# Patient Record
Sex: Female | Born: 1945 | Race: White | Hispanic: No | Smoking: Never smoker
Health system: Southern US, Community
[De-identification: ages and names within clinical notes are randomized; demographics above are authoritative.]

## PROBLEM LIST (undated history)

## (undated) DIAGNOSIS — E7800 Pure hypercholesterolemia, unspecified: Secondary | ICD-10-CM

## (undated) DIAGNOSIS — A044 Other intestinal Escherichia coli infections: Secondary | ICD-10-CM

## (undated) DIAGNOSIS — K449 Diaphragmatic hernia without obstruction or gangrene: Secondary | ICD-10-CM

---

## 2000-02-26 ENCOUNTER — Encounter: Admission: RE | Admit: 2000-02-26 | Discharge: 2000-02-26 | Payer: Self-pay | Admitting: *Deleted

## 2000-02-26 ENCOUNTER — Encounter: Payer: Self-pay | Admitting: Family Medicine

## 2001-09-09 ENCOUNTER — Ambulatory Visit (HOSPITAL_COMMUNITY): Admission: RE | Admit: 2001-09-09 | Discharge: 2001-09-09 | Payer: Self-pay | Admitting: Internal Medicine

## 2001-09-09 ENCOUNTER — Encounter: Payer: Self-pay | Admitting: Internal Medicine

## 2004-07-04 ENCOUNTER — Ambulatory Visit (HOSPITAL_COMMUNITY): Admission: RE | Admit: 2004-07-04 | Discharge: 2004-07-04 | Payer: Self-pay | Admitting: Family Medicine

## 2008-08-25 ENCOUNTER — Ambulatory Visit: Payer: Self-pay | Admitting: Orthopedic Surgery

## 2008-08-25 DIAGNOSIS — M7750 Other enthesopathy of unspecified foot: Secondary | ICD-10-CM | POA: Insufficient documentation

## 2008-08-29 ENCOUNTER — Telehealth: Payer: Self-pay | Admitting: Orthopedic Surgery

## 2008-08-30 ENCOUNTER — Telehealth: Payer: Self-pay | Admitting: Orthopedic Surgery

## 2008-08-30 ENCOUNTER — Ambulatory Visit: Payer: Self-pay | Admitting: Orthopedic Surgery

## 2008-09-19 ENCOUNTER — Encounter: Payer: Self-pay | Admitting: Orthopedic Surgery

## 2008-10-04 ENCOUNTER — Ambulatory Visit: Payer: Self-pay | Admitting: Orthopedic Surgery

## 2008-10-26 ENCOUNTER — Ambulatory Visit: Payer: Self-pay | Admitting: Orthopedic Surgery

## 2008-11-23 ENCOUNTER — Ambulatory Visit (HOSPITAL_COMMUNITY): Admission: RE | Admit: 2008-11-23 | Discharge: 2008-11-23 | Payer: Self-pay | Admitting: Nurse Practitioner

## 2008-11-23 IMAGING — CR DG CERVICAL SPINE COMPLETE 4+V
6 series · 6 of 6 positions shown · non-contrast
Comparison: No prior studies

CLINICAL DATA: Cervical pain and pain radiating down the left arm.

CERVICAL SPINE - COMPLETE 4+ VIEW

[view not recorded (1 of 6)]
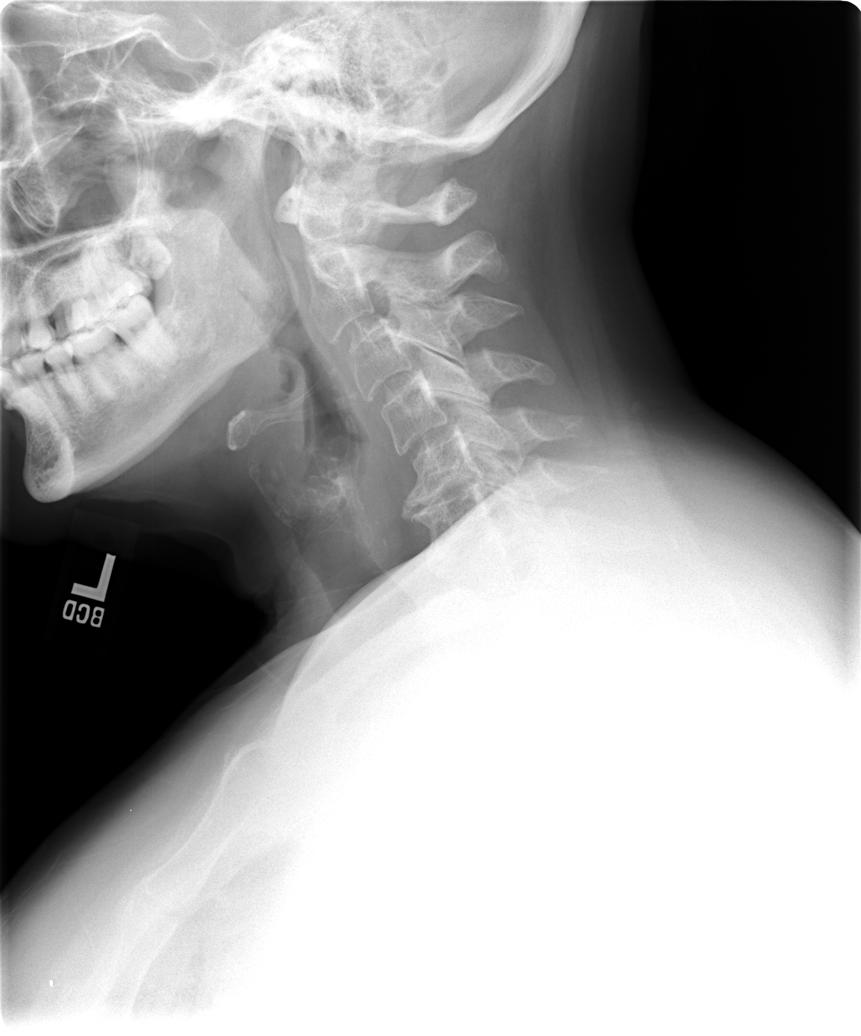

[view not recorded (2 of 6)]
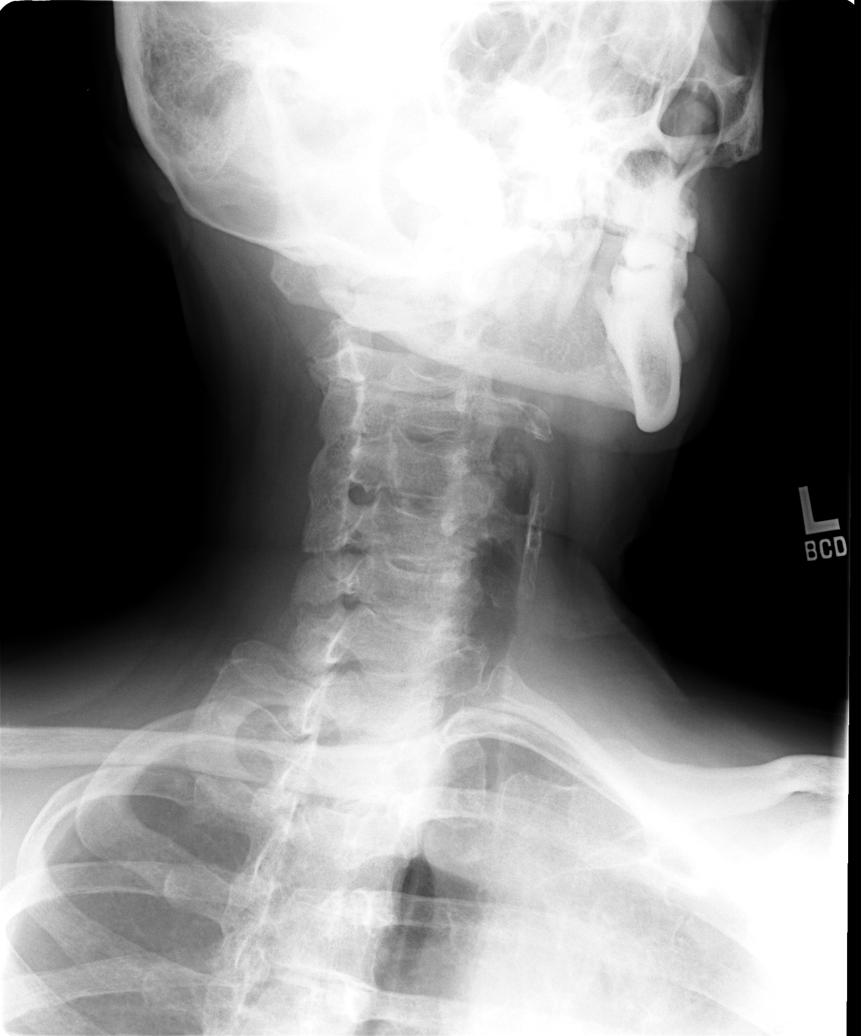

[view not recorded (3 of 6)]
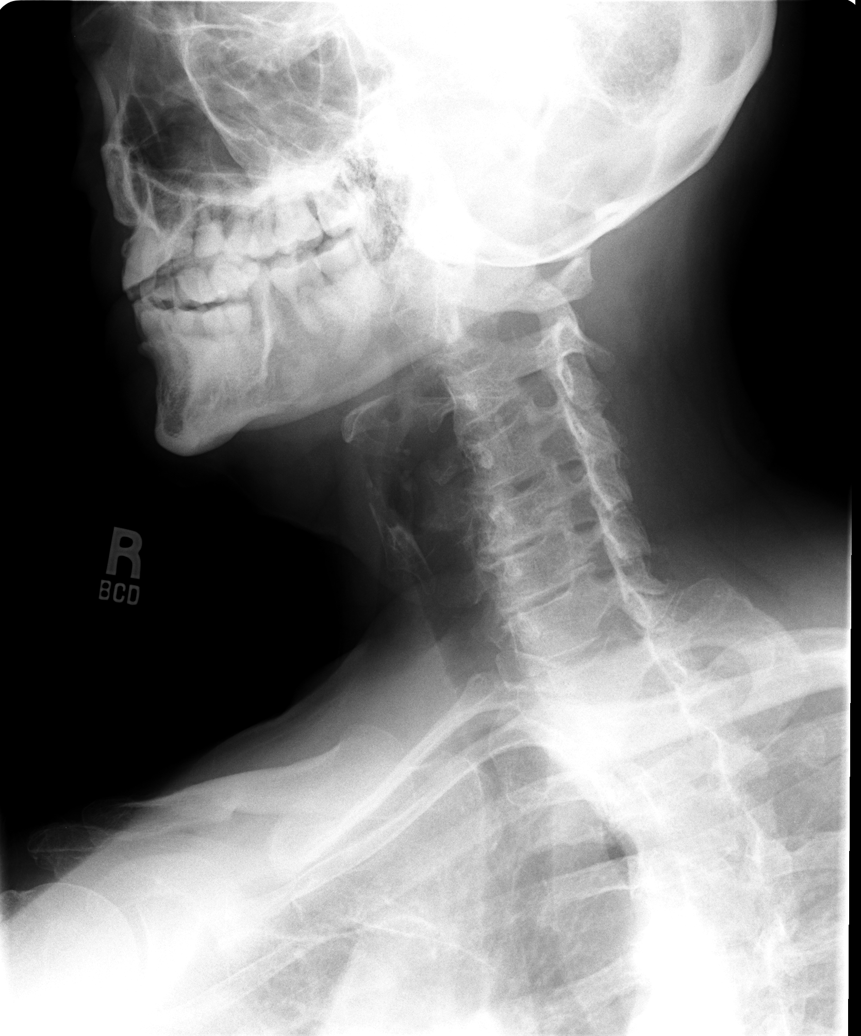

[view not recorded (4 of 6)]
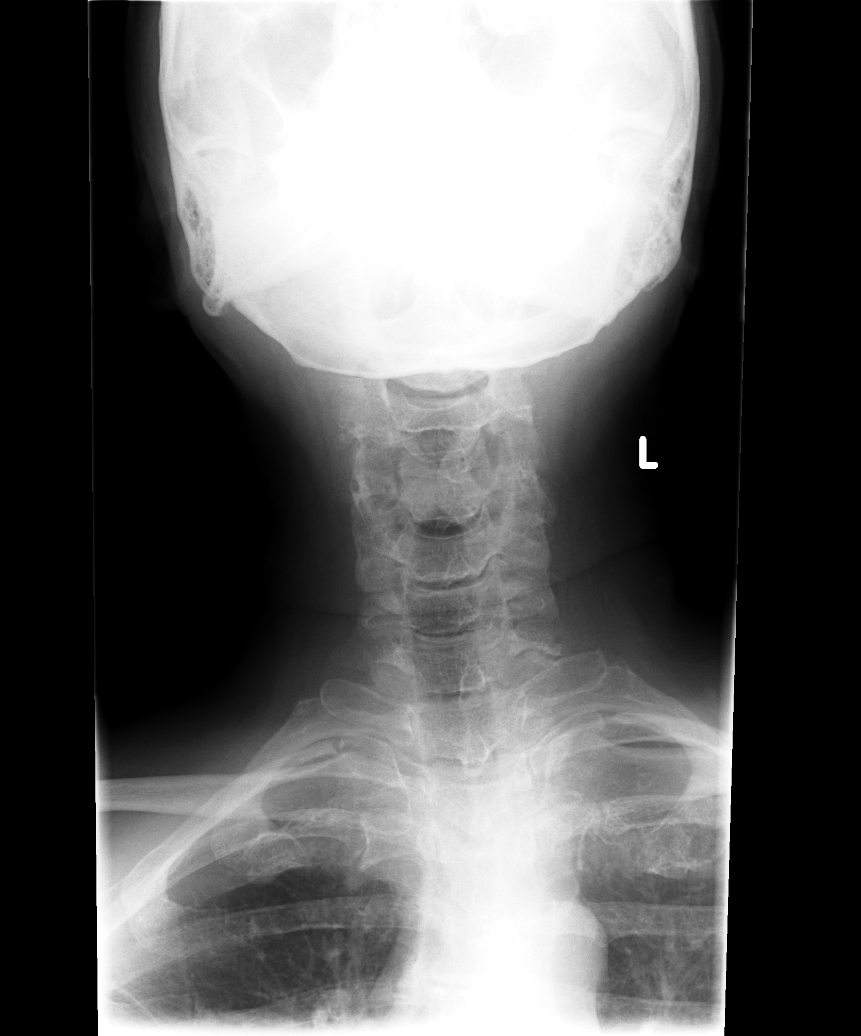

[view not recorded (5 of 6)]
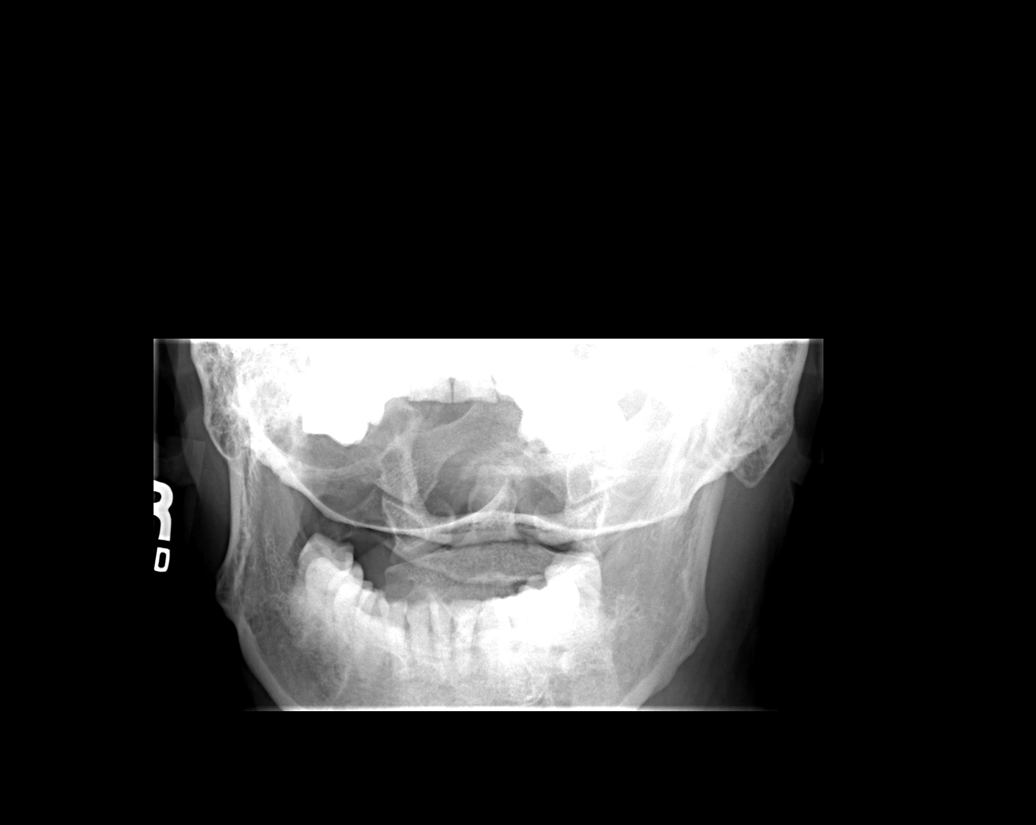

[view not recorded (6 of 6)]
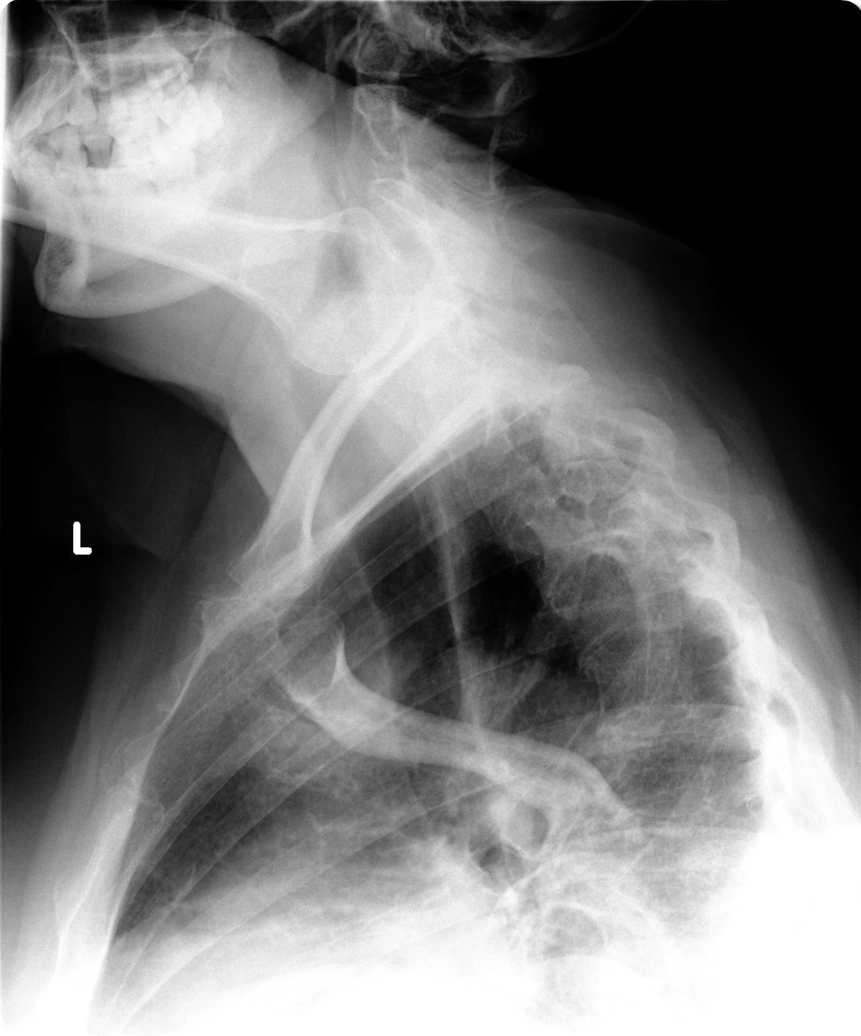

[6 of 6 positions shown; findings below may reference images not displayed]

FINDINGS: Degenerative space narrowing is noted C5-6 and C6-7
levels.  There is mild left C6-C7 bony foraminal narrowing.  The
right-sided cervical foramina are not well visualized on the
oblique view.  There are no destructive changes.
IMPRESSION: C5-6 and C6-7 degenerative space narrowing with mild left C6-7 bony
foraminal narrowing.  The right side cervical foramina are not well
visualized.

## 2008-12-23 ENCOUNTER — Encounter: Admission: RE | Admit: 2008-12-23 | Discharge: 2008-12-23 | Payer: Self-pay | Admitting: Family Medicine

## 2009-02-10 ENCOUNTER — Encounter: Admission: RE | Admit: 2009-02-10 | Discharge: 2009-02-10 | Payer: Self-pay | Admitting: Family Medicine

## 2009-02-28 ENCOUNTER — Ambulatory Visit (HOSPITAL_COMMUNITY): Admission: RE | Admit: 2009-02-28 | Discharge: 2009-02-28 | Payer: Self-pay | Admitting: Family Medicine

## 2009-03-07 ENCOUNTER — Encounter: Payer: Self-pay | Admitting: Internal Medicine

## 2009-03-20 ENCOUNTER — Ambulatory Visit (HOSPITAL_COMMUNITY): Admission: RE | Admit: 2009-03-20 | Discharge: 2009-03-20 | Payer: Self-pay | Admitting: Internal Medicine

## 2009-03-20 ENCOUNTER — Ambulatory Visit: Payer: Self-pay | Admitting: Internal Medicine

## 2009-03-24 ENCOUNTER — Encounter: Admission: RE | Admit: 2009-03-24 | Discharge: 2009-03-24 | Payer: Self-pay | Admitting: Family Medicine

## 2009-04-03 ENCOUNTER — Telehealth (INDEPENDENT_AMBULATORY_CARE_PROVIDER_SITE_OTHER): Payer: Self-pay

## 2009-04-05 ENCOUNTER — Telehealth (INDEPENDENT_AMBULATORY_CARE_PROVIDER_SITE_OTHER): Payer: Self-pay

## 2010-07-23 ENCOUNTER — Encounter: Payer: Self-pay | Admitting: Family Medicine

## 2012-08-24 ENCOUNTER — Other Ambulatory Visit (HOSPITAL_COMMUNITY): Payer: Self-pay | Admitting: Family Medicine

## 2012-09-01 ENCOUNTER — Other Ambulatory Visit (HOSPITAL_COMMUNITY): Payer: Self-pay

## 2012-09-01 ENCOUNTER — Ambulatory Visit (HOSPITAL_COMMUNITY): Payer: Self-pay

## 2012-09-08 ENCOUNTER — Ambulatory Visit (HOSPITAL_COMMUNITY)
Admission: RE | Admit: 2012-09-08 | Discharge: 2012-09-08 | Disposition: A | Discharge status: Home / Self Care | Payer: Medicare Other | Source: Ambulatory Visit | Attending: Family Medicine | Admitting: Family Medicine

## 2012-09-08 DIAGNOSIS — M899 Disorder of bone, unspecified: Secondary | ICD-10-CM | POA: Insufficient documentation

## 2012-09-08 DIAGNOSIS — Z780 Asymptomatic menopausal state: Secondary | ICD-10-CM | POA: Insufficient documentation

## 2012-09-08 DIAGNOSIS — Z1231 Encounter for screening mammogram for malignant neoplasm of breast: Secondary | ICD-10-CM | POA: Insufficient documentation

## 2012-09-09 ENCOUNTER — Other Ambulatory Visit (HOSPITAL_COMMUNITY): Payer: Self-pay

## 2015-01-25 ENCOUNTER — Emergency Department (HOSPITAL_COMMUNITY): Payer: Medicare HMO

## 2015-01-25 ENCOUNTER — Encounter (HOSPITAL_COMMUNITY): Payer: Self-pay | Admitting: Emergency Medicine

## 2015-01-25 ENCOUNTER — Inpatient Hospital Stay (HOSPITAL_COMMUNITY)
Admission: EM | Admit: 2015-01-25 | Discharge: 2015-01-27 | DRG: 445 | Disposition: A | Discharge status: Home / Self Care | Payer: Medicare HMO | Attending: Family Medicine | Admitting: Family Medicine

## 2015-01-25 ENCOUNTER — Emergency Department (HOSPITAL_COMMUNITY)
Admission: EM | Admit: 2015-01-25 | Discharge: 2015-01-25 | Disposition: A | Discharge status: Home / Self Care | Payer: Medicare HMO | Source: Home / Self Care | Attending: Emergency Medicine | Admitting: Emergency Medicine

## 2015-01-25 DIAGNOSIS — K810 Acute cholecystitis: Secondary | ICD-10-CM | POA: Diagnosis present

## 2015-01-25 DIAGNOSIS — K838 Other specified diseases of biliary tract: Secondary | ICD-10-CM | POA: Insufficient documentation

## 2015-01-25 DIAGNOSIS — Z8719 Personal history of other diseases of the digestive system: Secondary | ICD-10-CM | POA: Insufficient documentation

## 2015-01-25 DIAGNOSIS — Z7982 Long term (current) use of aspirin: Secondary | ICD-10-CM | POA: Insufficient documentation

## 2015-01-25 DIAGNOSIS — R52 Pain, unspecified: Secondary | ICD-10-CM

## 2015-01-25 DIAGNOSIS — R739 Hyperglycemia, unspecified: Secondary | ICD-10-CM

## 2015-01-25 DIAGNOSIS — E780 Pure hypercholesterolemia: Secondary | ICD-10-CM | POA: Insufficient documentation

## 2015-01-25 DIAGNOSIS — R197 Diarrhea, unspecified: Secondary | ICD-10-CM

## 2015-01-25 DIAGNOSIS — D649 Anemia, unspecified: Secondary | ICD-10-CM | POA: Diagnosis present

## 2015-01-25 DIAGNOSIS — Z8619 Personal history of other infectious and parasitic diseases: Secondary | ICD-10-CM

## 2015-01-25 DIAGNOSIS — K449 Diaphragmatic hernia without obstruction or gangrene: Secondary | ICD-10-CM | POA: Diagnosis present

## 2015-01-25 DIAGNOSIS — K8309 Other cholangitis: Secondary | ICD-10-CM | POA: Insufficient documentation

## 2015-01-25 DIAGNOSIS — K219 Gastro-esophageal reflux disease without esophagitis: Secondary | ICD-10-CM | POA: Diagnosis not present

## 2015-01-25 DIAGNOSIS — R140 Abdominal distension (gaseous): Secondary | ICD-10-CM | POA: Insufficient documentation

## 2015-01-25 DIAGNOSIS — Z79899 Other long term (current) drug therapy: Secondary | ICD-10-CM | POA: Insufficient documentation

## 2015-01-25 DIAGNOSIS — R1011 Right upper quadrant pain: Secondary | ICD-10-CM | POA: Diagnosis not present

## 2015-01-25 DIAGNOSIS — K8066 Calculus of gallbladder and bile duct with acute and chronic cholecystitis without obstruction: Secondary | ICD-10-CM | POA: Diagnosis not present

## 2015-01-25 DIAGNOSIS — R109 Unspecified abdominal pain: Secondary | ICD-10-CM

## 2015-01-25 DIAGNOSIS — K819 Cholecystitis, unspecified: Secondary | ICD-10-CM

## 2015-01-25 DIAGNOSIS — R110 Nausea: Secondary | ICD-10-CM | POA: Insufficient documentation

## 2015-01-25 DIAGNOSIS — Z8249 Family history of ischemic heart disease and other diseases of the circulatory system: Secondary | ICD-10-CM

## 2015-01-25 DIAGNOSIS — R1084 Generalized abdominal pain: Secondary | ICD-10-CM

## 2015-01-25 DIAGNOSIS — E871 Hypo-osmolality and hyponatremia: Secondary | ICD-10-CM

## 2015-01-25 DIAGNOSIS — E785 Hyperlipidemia, unspecified: Secondary | ICD-10-CM

## 2015-01-25 DIAGNOSIS — K8050 Calculus of bile duct without cholangitis or cholecystitis without obstruction: Secondary | ICD-10-CM | POA: Insufficient documentation

## 2015-01-25 DIAGNOSIS — K8062 Calculus of gallbladder and bile duct with acute cholecystitis without obstruction: Principal | ICD-10-CM | POA: Diagnosis present

## 2015-01-25 HISTORY — DX: Diaphragmatic hernia without obstruction or gangrene: K44.9

## 2015-01-25 HISTORY — DX: Pure hypercholesterolemia, unspecified: E78.00

## 2015-01-25 HISTORY — DX: Other intestinal Escherichia coli infections: A04.4

## 2015-01-25 LAB — COMPREHENSIVE METABOLIC PANEL
ALK PHOS: 70 U/L (ref 38–126)
ALT: 42 U/L (ref 14–54)
AST: 29 U/L (ref 15–41)
Albumin: 3.4 g/dL — ABNORMAL LOW (ref 3.5–5.0)
Anion gap: 10 (ref 5–15)
BILIRUBIN TOTAL: 0.4 mg/dL (ref 0.3–1.2)
BUN: 10 mg/dL (ref 6–20)
CALCIUM: 8.6 mg/dL — AB (ref 8.9–10.3)
CO2: 26 mmol/L (ref 22–32)
Chloride: 96 mmol/L — ABNORMAL LOW (ref 101–111)
Creatinine, Ser: 0.69 mg/dL (ref 0.44–1.00)
GFR calc non Af Amer: >60 mL/min (ref >60)
Glucose, Bld: 179 mg/dL — ABNORMAL HIGH (ref 65–99)
POTASSIUM: 4.3 mmol/L (ref 3.5–5.1)
SODIUM: 132 mmol/L — AB (ref 135–145)
Total Protein: 8 g/dL (ref 6.5–8.1)

## 2015-01-25 LAB — CBC WITH DIFFERENTIAL/PLATELET
BASOS PCT: 0 % (ref 0–1)
Basophils Absolute: 0 10*3/uL (ref 0.0–0.1)
Eosinophils Absolute: 0 10*3/uL (ref 0.0–0.7)
Eosinophils Relative: 0 % (ref 0–5)
HCT: 38 % (ref 36.0–46.0)
HEMOGLOBIN: 12.5 g/dL (ref 12.0–15.0)
LYMPHS ABS: 1.2 10*3/uL (ref 0.7–4.0)
Lymphocytes Relative: 9 % — ABNORMAL LOW (ref 12–46)
MCH: 29.4 pg (ref 26.0–34.0)
MCHC: 32.9 g/dL (ref 30.0–36.0)
MCV: 89.4 fL (ref 78.0–100.0)
MONO ABS: 1.5 10*3/uL — AB (ref 0.1–1.0)
MONOS PCT: 11 % (ref 3–12)
NEUTROS ABS: 10.9 10*3/uL — AB (ref 1.7–7.7)
Neutrophils Relative %: 80 % — ABNORMAL HIGH (ref 43–77)
Platelets: 272 10*3/uL (ref 150–400)
RBC: 4.25 MIL/uL (ref 3.87–5.11)
RDW: 12.7 % (ref 11.5–15.5)
WBC: 13.6 10*3/uL — AB (ref 4.0–10.5)

## 2015-01-25 LAB — LIPASE, BLOOD: Lipase: 16 U/L — ABNORMAL LOW (ref 22–51)

## 2015-01-25 MED ORDER — METRONIDAZOLE IN NACL 5-0.79 MG/ML-% IV SOLN
500.0000 mg | Freq: Once | INTRAVENOUS | Status: AC
  Administered 2015-01-25: 500 mg via INTRAVENOUS
  Filled 2015-01-25: qty 100

## 2015-01-25 MED ORDER — SODIUM CHLORIDE 0.9 % IV SOLN
INTRAVENOUS | Status: DC
  Administered 2015-01-25 – 2015-01-26 (×2): via INTRAVENOUS

## 2015-01-25 MED ORDER — HYDROMORPHONE HCL 1 MG/ML IJ SOLN: 0.5000 mg | INTRAMUSCULAR | Status: AC | PRN

## 2015-01-25 MED ORDER — PANTOPRAZOLE SODIUM 40 MG PO TBEC
40.0000 mg | DELAYED_RELEASE_TABLET | Freq: Every day | ORAL | Status: DC
  Administered 2015-01-26: 40 mg via ORAL
  Filled 2015-01-25: qty 1

## 2015-01-25 MED ORDER — CIPROFLOXACIN IN D5W 400 MG/200ML IV SOLN
400.0000 mg | Freq: Once | INTRAVENOUS | Status: AC
  Administered 2015-01-25: 400 mg via INTRAVENOUS
  Filled 2015-01-25: qty 200

## 2015-01-25 MED ORDER — ACETAMINOPHEN 650 MG RE SUPP: 650.0000 mg | Freq: Four times a day (QID) | RECTAL | Status: DC | PRN

## 2015-01-25 MED ORDER — ONDANSETRON HCL 4 MG/2ML IJ SOLN
4.0000 mg | Freq: Once | INTRAMUSCULAR | Status: AC
  Administered 2015-01-25: 4 mg via INTRAVENOUS
  Filled 2015-01-25: qty 2

## 2015-01-25 MED ORDER — ONDANSETRON HCL 4 MG/2ML IJ SOLN: 4.0000 mg | Freq: Four times a day (QID) | INTRAMUSCULAR | Status: DC | PRN

## 2015-01-25 MED ORDER — SODIUM CHLORIDE 0.9 % IV SOLN: INTRAVENOUS | Status: AC

## 2015-01-25 MED ORDER — PANTOPRAZOLE SODIUM 40 MG IV SOLR
40.0000 mg | Freq: Once | INTRAVENOUS | Status: AC
  Administered 2015-01-25: 40 mg via INTRAVENOUS
  Filled 2015-01-25: qty 40

## 2015-01-25 MED ORDER — SODIUM CHLORIDE 0.9 % IV BOLUS (SEPSIS)
1000.0000 mL | Freq: Once | INTRAVENOUS | Status: AC
  Administered 2015-01-25: 1000 mL via INTRAVENOUS

## 2015-01-25 MED ORDER — ONDANSETRON HCL 4 MG/2ML IJ SOLN: 4.0000 mg | Freq: Three times a day (TID) | INTRAMUSCULAR | Status: AC | PRN

## 2015-01-25 MED ORDER — IOHEXOL 300 MG/ML  SOLN
25.0000 mL | Freq: Once | INTRAMUSCULAR | Status: AC | PRN
  Administered 2015-01-25: 25 mL via ORAL

## 2015-01-25 MED ORDER — ACETAMINOPHEN 325 MG PO TABS
650.0000 mg | ORAL_TABLET | Freq: Four times a day (QID) | ORAL | Status: DC | PRN
  Administered 2015-01-26 – 2015-01-27 (×2): 650 mg via ORAL
  Filled 2015-01-25 (×2): qty 2

## 2015-01-25 MED ORDER — IOHEXOL 300 MG/ML  SOLN
100.0000 mL | Freq: Once | INTRAMUSCULAR | Status: AC | PRN
  Administered 2015-01-25: 100 mL via INTRAVENOUS

## 2015-01-25 MED ORDER — ONDANSETRON HCL 4 MG PO TABS: 4.0000 mg | ORAL_TABLET | Freq: Four times a day (QID) | ORAL | Status: DC | PRN

## 2015-01-25 MED ORDER — ASPIRIN EC 81 MG PO TBEC
81.0000 mg | DELAYED_RELEASE_TABLET | Freq: Every day | ORAL | Status: DC
  Administered 2015-01-25 – 2015-01-26 (×2): 81 mg via ORAL
  Filled 2015-01-25 (×2): qty 1

## 2015-01-25 MED ORDER — SODIUM CHLORIDE 0.9 % IV SOLN
INTRAVENOUS | Status: DC
  Administered 2015-01-25 – 2015-01-27 (×2): via INTRAVENOUS

## 2015-01-25 MED ORDER — HEPARIN SODIUM (PORCINE) 5000 UNIT/ML IJ SOLN
5000.0000 [IU] | Freq: Three times a day (TID) | INTRAMUSCULAR | Status: DC
  Administered 2015-01-25 – 2015-01-26 (×2): 5000 [IU] via SUBCUTANEOUS
  Filled 2015-01-25 (×2): qty 1

## 2015-01-25 MED ORDER — CIPROFLOXACIN IN D5W 400 MG/200ML IV SOLN
400.0000 mg | Freq: Two times a day (BID) | INTRAVENOUS | Status: DC
  Administered 2015-01-26: 400 mg via INTRAVENOUS
  Filled 2015-01-25: qty 200

## 2015-01-25 MED ORDER — SIMVASTATIN 20 MG PO TABS
20.0000 mg | ORAL_TABLET | Freq: Every day | ORAL | Status: DC
  Administered 2015-01-25: 20 mg via ORAL
  Filled 2015-01-25: qty 1

## 2015-01-25 MED ORDER — MORPHINE SULFATE 4 MG/ML IJ SOLN
4.0000 mg | INTRAMUSCULAR | Status: DC | PRN
  Administered 2015-01-25 – 2015-01-26 (×2): 4 mg via INTRAVENOUS
  Filled 2015-01-25 (×2): qty 1

## 2015-01-25 MED ORDER — HYDROMORPHONE HCL 1 MG/ML IJ SOLN
1.0000 mg | Freq: Once | INTRAMUSCULAR | Status: AC
  Administered 2015-01-25: 1 mg via INTRAVENOUS
  Filled 2015-01-25: qty 1

## 2015-01-25 NOTE — ED Notes (Addendum)
Placed pt on 3L [place]. Pt's sats drop into 80s when resting. Pt sats 94-95% on 3L.

## 2015-01-25 NOTE — H&P (Signed)
[facility] History and Physical  Melissa Bullock JXB:[id number] DOB: 1946-02-26 DOA: 01/25/2015  Referring physician: Dr Clarene Duke - [facility] PCP: Alice Reichert, MD   Chief Complaint: Abd pain  HPI: Melissa Bullock is a 69 y.o. female  Abdominal pain. Right upper quadrant. Ongoing for 2 weeks. Comes and goes. Associated with loose stools. Started a PPI and Carafate without improvement. Worse with meals. Nothing makes her symptoms better other than not eating. Pain does not radiate.    Review of Systems:  Constitutional:  No night sweats, Fevers, chills, fatigue.  HEENT:  No headaches, Difficulty swallowing,Tooth/dental problems,Sore throat,  No sneezing, itching, ear ache, nasal congestion, post nasal drip,  Cardio-vascular:  No chest pain, Orthopnea, PND, swelling in lower extremities, anasarca, dizziness, palpitations  GI: Per HPI Resp:   No shortness of breath with exertion or at rest. No excess mucus, no productive cough, No non-productive cough, No coughing up of blood.No change in color of mucus.No wheezing.No chest wall deformity  Skin:  no rash or lesions.  GU:  no dysuria, change in color of urine, no urgency or frequency. No flank pain.  Musculoskeletal:   No joint pain or swelling. No decreased range of motion. No back pain.  Psych:  No change in mood or affect. No depression or anxiety. No memory loss.   Past Medical History  Diagnosis Date  . Hypercholesteremia   . E coli enteritis   . Hiatal hernia    History reviewed. No pertinent past surgical history. Social History:  reports that she has never smoked. She does not have any smokeless tobacco history on file. She reports that she does not drink alcohol. Her drug history is not on file.  Allergies  Allergen Reactions  . Sulfonamide Derivatives Nausea And Vomiting    Family History  Problem Relation Age of Onset  . Heart disease Mother      Prior to Admission medications   Medication Sig  Start Date End Date Taking? Authorizing Provider  alum & mag hydroxide-simeth (MAALOX PLUS) 400-400-40 MG/5ML suspension Take 15 mLs by mouth every 6 (six) hours as needed for indigestion.   Yes Historical Provider, MD  aspirin EC 81 MG tablet Take 81 mg by mouth daily.   Yes Historical Provider, MD  co-enzyme Q-10 30 MG capsule Take 30 mg by mouth 3 (three) times daily.   Yes Historical Provider, MD  dicyclomine (BENTYL) 10 MG capsule Take 10 mg by mouth 4 (four) times daily -  before meals and at bedtime.   Yes Historical Provider, MD  Multiple Vitamin (MULTIVITAMIN WITH MINERALS) TABS tablet Take 1 tablet by mouth daily.   Yes Historical Provider, MD  multivitamin-lutein (OCUVITE-LUTEIN) CAPS capsule Take 1 capsule by mouth daily.   Yes Historical Provider, MD  pantoprazole (PROTONIX) 40 MG tablet Take 40 mg by mouth daily.   Yes Historical Provider, MD  simvastatin (ZOCOR) 20 MG tablet Take 20 mg by mouth daily.   Yes Historical Provider, MD  sucralfate (CARAFATE) 1 GM/10ML suspension Take 1 g by mouth 4 (four) times daily -  with meals and at bedtime. Starting 01/20/2015 x 10 days.   Yes Historical Provider, MD   Physical Exam: Filed Vitals:   01/25/15 1645 01/25/15 1845  BP: 105/82 124/49  Pulse: 94 105  Temp: 100.5 F (38.1 C) 100.2 F (37.9 C)  TempSrc: Oral Oral  Resp: 18 18  Height: 5\' 4"  (1.626 m) 5\' 4"  (1.626 m)  Weight: 79.379 kg (175 lb) 81.784 kg (  180 lb 4.8 oz)  SpO2: 94% 92%    Wt Readings from Last 3 Encounters:  01/25/15 81.784 kg (180 lb 4.8 oz)  01/25/15 79.379 kg (175 lb)  08/25/08 84.823 kg (187 lb)    General:  Appears calm and comfortable Eyes:  PERRL, normal lids, irises & conjunctiva ENT:  grossly normal hearing, lips & tongue Neck:  no LAD, masses or thyromegaly Cardiovascular:  RRR, no m/r/g. No LE edema. Telemetry:  SR, no arrhythmias  Respiratory:  CTA bilaterally, no w/r/r. Normal respiratory effort. Abdomen:  Nondistended, normoactive bowel  sounds, mild tenderness to palpation of the right upper quadrant. Skin:  no rash or induration seen on limited exam Musculoskeletal:  grossly normal tone BUE/BLE Psychiatric:  grossly normal mood and affect, speech fluent and appropriate Neurologic:  grossly non-focal.          Labs on Admission:  Basic Metabolic Panel:  Recent Labs Lab 01/25/15 0915  NA 132*  K 4.3  CL 96*  CO2 26  GLUCOSE 179*  BUN 10  CREATININE 0.69  CALCIUM 8.6*   Liver Function Tests:  Recent Labs Lab 01/25/15 0915  AST 29  ALT 42  ALKPHOS 70  BILITOT 0.4  PROT 8.0  ALBUMIN 3.4*    Recent Labs Lab 01/25/15 0915  LIPASE 16*   No results for input(s): AMMONIA in the last 168 hours. CBC:  Recent Labs Lab 01/25/15 0915  WBC 13.6*  NEUTROABS 10.9*  HGB 12.5  HCT 38.0  MCV 89.4  PLT 272   Cardiac Enzymes: No results for input(s): CKTOTAL, CKMB, CKMBINDEX, TROPONINI in the last 168 hours.  BNP (last 3 results) No results for input(s): BNP in the last 8760 hours.  ProBNP (last 3 results) No results for input(s): PROBNP in the last 8760 hours.  CBG: No results for input(s): GLUCAP in the last 168 hours.  Radiological Exams on Admission: Ct Abdomen Pelvis W Contrast  01/25/2015   CLINICAL DATA:  Upper abdominal pain for 2 weeks  EXAM: CT ABDOMEN AND PELVIS WITH CONTRAST  TECHNIQUE: Multidetector CT imaging of the abdomen and pelvis was performed using the standard protocol following bolus administration of intravenous contrast.  CONTRAST:  25mL OMNIPAQUE IOHEXOL 300 MG/ML SOLN, OMNIPAQUE IOHEXOL 300 MG/ML SOLN  COMPARISON:  None.  FINDINGS: The gallbladder is markedly distended. There is wall thickening. There are inflammatory changes about the gallbladder. Findings are compatible with acute cholecystitis.  There is also noted to be significant intrahepatic biliary dilatation. The common bile duct is also dilated up to 13 mm. There is no obvious pancreatic head mass. There are no  definite ductal stones.  Spleen, remainder of the pancreas, adrenal glands, and kidneys are within normal limits.  Small periportal and gastrohepatic ligament nodes are present. Para-aortic nodes are noted but none are greater than 1 cm in short axis diameter  Retro aortic left renal vein anatomy.  Normal appendix.  Bladder, uterus, and adnexa are unremarkable  Sigmoid diverticulosis without evidence of acute diverticulitis.  Advanced degenerative disc disease at L4-5 and L5-S1 with posterior disc osteophytes and vacuum. Concentric disc bulge at L3-4 is noted.  IMPRESSION: Findings above of compatible with acute cholecystitis. The gallbladder is dilated.  There is intra and extrahepatic biliary dilatation should testing biliary obstruction. There is no obvious common duct stone or pancreatic head mass. ERCP or MRCP would be helpful to further characterize.   Electronically Signed   By: Jolaine Click M.D.   On: 01/25/2015 11:16  US Abdomen Limited Ruq  01/25/2015   CLINICAL DATA:  Acute right upper quadrant abdominal pain.  EXAM: US ABDOMEN LIMITED - RIGHT UPPER QUADRANT  COMPARISON:  CT scan of same day.  FINDINGS: Gallbladder:  Cholelithiasis is noted with moderate gallbladder wall thickening measuring approximately 7 mm. No sonographic Murphy's sign is noted. No pericholecystic fluid is noted.  Common bile duct:  Diameter: 12 mm which is abnormally dilated.  Liver:  Intrahepatic ductal dilatation is noted. Within normal limits in parenchymal echogenicity.  IMPRESSION: Cholelithiasis is noted with significant gallbladder wall thickening concerning for acute cholecystitis. Significant intrahepatic and extrahepatic biliary dilatation is noted concerning for distal common bile duct obstruction.   Electronically Signed   By: Lupita Raider, M.D.   On: 01/25/2015 13:26      Assessment/Plan Principal Problem:   Acute cholecystitis Active Problems:   Hyponatremia   Hyperglycemia   HLD (hyperlipidemia)    GERD without esophagitis   Abd pain: Likely from occult acute cholecystitis as noted on ultrasound and CT. Surgery consult by ED physician and agrees with admission for likely surgery. Lipase nml - MedSurg - Nothing by mouth after midnight -  coags  - IVF  - EKG (fm h/o heart disease) - f/u Surgery recs.   Hyponatremia: 132 on admission - IVF  Hyperglycemia: - A1c  HLD: - continue statin  GERD/hiatal Hernia: - continue PPI   Code Status: FULL DVT Prophylaxis: Hep Family Communication: None (indicate person spoken with, if applicable, with phone number if by telephone)  Disposition Plan: Pending improvement  [place] Severs, Elmon Else, MD Family Medicine [facility] [email or web address] Password [email or web address]

## 2015-01-25 NOTE — Progress Notes (Signed)
ANTIBIOTIC CONSULT NOTE-Preliminary  Pharmacy Consult for Cipro Indication: cholecystitis  Allergies  Allergen Reactions  . Sulfonamide Derivatives Nausea And Vomiting    Patient Measurements: Height:  (162.6 cm) Weight: 175 lb (79.379 kg) IBW/kg (Calculated) : 54.7  Vital Signs: Temp: 100.5 F (38.1 C) (07/27 1645) Temp Source: Oral (07/27 1645) BP: 105/82 mmHg (07/27 1645) Pulse Rate: 94 (07/27 1645)  Labs:  Recent Labs  01/25/15 0915  WBC 13.6*  HGB 12.5  PLT 272  CREATININE 0.69    Estimated Creatinine Clearance: 68.6 mL/min (by C-G formula based on Cr of 0.69).  No results for input(s): VANCOTROUGH, VANCOPEAK, VANCORANDOM, GENTTROUGH, GENTPEAK, GENTRANDOM, TOBRATROUGH, TOBRAPEAK, TOBRARND, AMIKACINPEAK, AMIKACINTROU, AMIKACIN in the last 72 hours.   Microbiology: No results found for this or any previous visit (from the past 720 hour(s)).  Medical History: Past Medical History  Diagnosis Date  . Hypercholesteremia   . E coli enteritis   . Hiatal hernia    Anti-infectives    Start     Dose/Rate Route Frequency Ordered Stop   01/25/15 1730  ciprofloxacin (CIPRO) IVPB 400 mg     400 mg 200 mL/hr over 60 Minutes Intravenous  Once 01/25/15 1659     01/25/15 1700  metroNIDAZOLE (FLAGYL) IVPB 500 mg     500 mg 100 mL/hr over 60 Minutes Intravenous  Once 01/25/15 1649        Assessment: 69yo female with good renal fxn.  Pt admitted with cholecystitis, pharmacy asked to initiate Cipro.    Goal of Therapy:  Eradicate infection.  Plan:  Preliminary review of pertinent patient information completed.  Protocol will be initiated with a one-time dose(s) of Cipro .  Jeani Hawking clinical pharmacist will complete review during morning rounds to assess patient and finalize treatment regimen.  Valrie Hart A, RPH 01/25/2015,5:00 PM

## 2015-01-25 NOTE — ED Provider Notes (Signed)
CSN: [id number]     Arrival date & time 01/25/15  4098 History   This chart was scribed for Melissa Berkshire, MD by Evon Slack, ED Scribe. This patient was seen in room [facility] and the patient's care was started at 8:58 AM.      Chief Complaint  Patient presents with  . Abdominal Pain    Patient is a 69 y.o. female presenting with abdominal pain. The history is provided by the patient. No language interpreter was used.  Abdominal Pain Pain location:  Generalized Pain severity:  Moderate Onset quality:  Gradual Duration:  2 weeks Relieved by:  Nothing Ineffective treatments:  Antacids Associated symptoms: diarrhea and nausea   Associated symptoms: no chest pain, no cough, no fatigue, no hematuria and no vomiting    HPI Comments: Melissa Bullock is a 69 y.o. female who presents to the Emergency Department complaining of abdominal pain onset 2 weeks prior. She states she has associated distention, nausea and watery diarrhea. Pt states that she went to her PCP 5 days prior and was prescribed Carafate and Protonix with no relief. She states that he PCP told her she may have IBS. Pt doesn't report any alleviating symptoms. Pt reports Hx of ecoli several years prior.   Past Medical History  Diagnosis Date  . Hypercholesteremia   . E coli enteritis   . Hiatal hernia    History reviewed. No pertinent past surgical history. No family history on file. History  Substance Use Topics  . Smoking status: Never Smoker   . Smokeless tobacco: Not on file  . Alcohol Use: No   OB History    No data available      Review of Systems  Constitutional: Negative for appetite change and fatigue.  HENT: Negative for congestion, ear discharge and sinus pressure.   Eyes: Negative for discharge.  Respiratory: Negative for cough.   Cardiovascular: Negative for chest pain.  Gastrointestinal: Positive for nausea, abdominal pain, diarrhea and abdominal distention. Negative for vomiting.   Genitourinary: Negative for frequency and hematuria.  Musculoskeletal: Negative for back pain.  Skin: Negative for rash.  Neurological: Negative for seizures and headaches.  Psychiatric/Behavioral: Negative for hallucinations.  All other systems reviewed and are negative.    Allergies  Sulfonamide derivatives  Home Medications   Prior to Admission medications   Medication Sig Start Date End Date Taking? Authorizing Provider  alum & mag hydroxide-simeth (MAALOX PLUS) 400-400-40 MG/5ML suspension Take 15 mLs by mouth every 6 (six) hours as needed for indigestion.   Yes Historical Provider, MD  aspirin EC 81 MG tablet Take 81 mg by mouth daily.   Yes Historical Provider, MD  co-enzyme Q-10 30 MG capsule Take 30 mg by mouth 3 (three) times daily.   Yes Historical Provider, MD  dicyclomine (BENTYL) 10 MG capsule Take 10 mg by mouth 4 (four) times daily -  before meals and at bedtime.   Yes Historical Provider, MD  Multiple Vitamin (MULTIVITAMIN WITH MINERALS) TABS tablet Take 1 tablet by mouth daily.   Yes Historical Provider, MD  multivitamin-lutein (OCUVITE-LUTEIN) CAPS capsule Take 1 capsule by mouth daily.   Yes Historical Provider, MD  pantoprazole (PROTONIX) 40 MG tablet Take 40 mg by mouth daily.   Yes Historical Provider, MD  simvastatin (ZOCOR) 20 MG tablet Take 20 mg by mouth daily.   Yes Historical Provider, MD  sucralfate (CARAFATE) 1 GM/10ML suspension Take 1 g by mouth 4 (four) times daily -  with meals and at  bedtime. Starting 01/20/2015 x 10 days.   Yes Historical Provider, MD   BP 157/70 mmHg  Pulse 98  Temp(Src) 99.2 F (37.3 C) (Oral)  Resp 18  Ht 5' 4.5" (1.638 m)  Wt 175 lb (79.379 kg)  BMI 29.59 kg/m2  SpO2 96%   Physical Exam  Constitutional: She is oriented to person, place, and time. She appears well-developed.  HENT:  Head: Normocephalic.  Eyes: Conjunctivae and EOM are normal. No scleral icterus.  Neck: Neck supple. No thyromegaly present.   Cardiovascular: Normal rate and regular rhythm.  Exam reveals no gallop and no friction rub.   No murmur heard. Pulmonary/Chest: No stridor. She has no wheezes. She has no rales. She exhibits no tenderness.  Abdominal: She exhibits no distension. There is generalized tenderness (moderate ). There is no rebound.  Musculoskeletal: Normal range of motion. She exhibits no edema.  Lymphadenopathy:    She has no cervical adenopathy.  Neurological: She is oriented to person, place, and time. She exhibits normal muscle tone. Coordination normal.  Skin: No rash noted. No erythema.  Psychiatric: She has a normal mood and affect. Her behavior is normal.  Nursing note and vitals reviewed.   ED Course  Procedures (including critical care time) DIAGNOSTIC STUDIES: Oxygen Saturation is 98% on RA, normal by my interpretation.    COORDINATION OF CARE: 9:11 AM-Discussed treatment plan with pt at bedside and pt agreed to plan.     Labs Review Labs Reviewed  CBC WITH DIFFERENTIAL/PLATELET - Abnormal; Notable for the following:    WBC 13.6 (*)    Neutrophils Relative % 80 (*)    Neutro Abs 10.9 (*)    Lymphocytes Relative 9 (*)    Monocytes Absolute 1.5 (*)    All other components within normal limits  COMPREHENSIVE METABOLIC PANEL - Abnormal; Notable for the following:    Sodium 132 (*)    Chloride 96 (*)    Glucose, Bld 179 (*)    Calcium 8.6 (*)    Albumin 3.4 (*)    All other components within normal limits  LIPASE, BLOOD - Abnormal; Notable for the following:    Lipase 16 (*)    All other components within normal limits    Imaging Review No results found.   EKG Interpretation None      MDM   Final diagnoses:  None    Ct showed cholecystitis,  Surgery consulted and rec [place].  After [place] Dr. Lovell Sheehan stated that the pt could go home to take care if her dogs.  Then she is to return to the hospital to be admitted to medicine. Put on antibiotics . He will follow the pt in the am and  he felt gi should be consulted to see the pt in the am     Melissa Berkshire, MD 01/25/15 1440

## 2015-01-25 NOTE — Consult Note (Signed)
Admission noted.  Has dilated common bile duct.  Will need MRCP tomorrow to assess biliary tree prior to surgical intervention.  Will keep on clears.  No need to be NPO after midnight.  Further management pending those results.

## 2015-01-25 NOTE — ED Notes (Signed)
Pt was here early and dx with cholecystitis, Pt seen by Dr Lovell Sheehan. Pt had to go home to take of busy, Pt is back for admission, ED MD to assess and call Hospitalist

## 2015-01-25 NOTE — ED Notes (Signed)
MD Lovell Sheehan at bedside with patient. States pt needs an [place] and will check back after [place] is complete.

## 2015-01-25 NOTE — ED Notes (Signed)
MD Zammit at bedside. 

## 2015-01-25 NOTE — ED Notes (Signed)
Pt states her PCP sent her to ED for IV fluids and xray. Pt c/o of bloating, watery stools, and cramping in abdomen x 2 weeks. Pt told by PCP she may have a stomach ulcer or IBS.

## 2015-01-25 NOTE — Discharge Instructions (Signed)
Come back to the er today after you take care of your dogs

## 2015-01-25 NOTE — ED Provider Notes (Signed)
CSN: [id number]     Arrival date & time 01/25/15  1638 History   First MD Initiated Contact with Patient 01/25/15 1643     Chief Complaint  Patient presents with  . Abdominal Pain    cholecystistis      HPI  Pt was seen at 1645.   Per pt, c/o gradual onset and persistence of constant upper abd "pain" for the past 2 weeks. Has been associated with multiple intermittent episodes of "watery" diarrhea and nausea.  Describes the abd pain as "aching."  Denies vomiting, no fevers, no back pain, no rash, no CP/SOB, no black or blood in stools.       Past Medical History  Diagnosis Date  . Hypercholesteremia   . E coli enteritis   . Hiatal hernia    History reviewed. No pertinent past surgical history.  History  Substance Use Topics  . Smoking status: Never Smoker   . Smokeless tobacco: Not on file  . Alcohol Use: No    Review of Systems ROS: Statement: All systems negative except as marked or noted in the HPI; Constitutional: Negative for fever and chills. ; ; Eyes: Negative for eye pain, redness and discharge. ; ; ENMT: Negative for ear pain, hoarseness, nasal congestion, sinus pressure and sore throat. ; ; Cardiovascular: Negative for chest pain, palpitations, diaphoresis, dyspnea and peripheral edema. ; ; Respiratory: Negative for cough, wheezing and stridor. ; ; Gastrointestinal: +abd pain, nausea, diarrhea. Negative for vomiting, blood in stool, hematemesis, jaundice and rectal bleeding. . ; ; Genitourinary: Negative for dysuria, flank pain and hematuria. ; ; Musculoskeletal: Negative for back pain and neck pain. Negative for swelling and trauma.; ; Skin: Negative for pruritus, rash, abrasions, blisters, bruising and skin lesion.; ; Neuro: Negative for headache, lightheadedness and neck stiffness. Negative for weakness, altered level of consciousness , altered mental status, extremity weakness, paresthesias, involuntary movement, seizure and syncope.      Allergies  Sulfonamide  derivatives  Home Medications   Prior to Admission medications   Medication Sig Start Date End Date Taking? Authorizing Provider  alum & mag hydroxide-simeth (MAALOX PLUS) 400-400-40 MG/5ML suspension Take 15 mLs by mouth every 6 (six) hours as needed for indigestion.    Historical Provider, MD  aspirin EC 81 MG tablet Take 81 mg by mouth daily.    Historical Provider, MD  co-enzyme Q-10 30 MG capsule Take 30 mg by mouth 3 (three) times daily.    Historical Provider, MD  dicyclomine (BENTYL) 10 MG capsule Take 10 mg by mouth 4 (four) times daily -  before meals and at bedtime.    Historical Provider, MD  Multiple Vitamin (MULTIVITAMIN WITH MINERALS) TABS tablet Take 1 tablet by mouth daily.    Historical Provider, MD  multivitamin-lutein (OCUVITE-LUTEIN) CAPS capsule Take 1 capsule by mouth daily.    Historical Provider, MD  pantoprazole (PROTONIX) 40 MG tablet Take 40 mg by mouth daily.    Historical Provider, MD  simvastatin (ZOCOR) 20 MG tablet Take 20 mg by mouth daily.    Historical Provider, MD  sucralfate (CARAFATE) 1 GM/10ML suspension Take 1 g by mouth 4 (four) times daily -  with meals and at bedtime. Starting 01/20/2015 x 10 days.    Historical Provider, MD   BP 105/82 mmHg  Pulse 94  Temp(Src) 100.5 F (38.1 C) (Oral)  Resp 18  Ht 5\' 4"  (1.626 m)  Wt 175 lb (79.379 kg)  BMI 30.02 kg/m2  SpO2 94% Physical Exam  1650:  Physical examination:  Nursing notes reviewed; Vital signs and O2 SAT reviewed;  Constitutional: Well developed, Well nourished, Well hydrated, In no acute distress; Head:  Normocephalic, atraumatic; Eyes: EOMI, PERRL, No scleral icterus; ENMT: Mouth and pharynx normal, Mucous membranes moist; Neck: Supple, Full range of motion, No lymphadenopathy; Cardiovascular: Regular rate and rhythm, No gallop; Respiratory: Breath sounds clear & equal bilaterally, No wheezes.  Speaking full sentences with ease, Normal respiratory effort/excursion; Chest: Nontender, Movement  normal; Abdomen: Soft, +RUQ tenderness to palp. No rebound or guarding. Nondistended, Normal bowel sounds; Genitourinary: No CVA tenderness; Extremities: Pulses normal, No tenderness, No edema, No calf edema or asymmetry.; Neuro: AA&Ox3, Major CN grossly intact.  Speech clear. No gross focal motor or sensory deficits in extremities. Climbs on and off chair in exam room easily by herself. Gait steady.; Skin: Color normal, Warm, Dry.   ED Course  Procedures       EKG Interpretation None      MDM  MDM Reviewed: previous chart, nursing note and vitals Reviewed previous: labs, ultrasound and CT scan      Results for orders placed or performed during the hospital encounter of 01/25/15  CBC with Differential/Platelet  Result Value Ref Range   WBC 13.6 (H) 4.0 - 10.5 K/uL   RBC 4.25 3.87 - 5.11 MIL/uL   Hemoglobin 12.5 12.0 - 15.0 g/dL   HCT 16.1 09.6 - 04.5 %   MCV 89.4 78.0 - 100.0 fL   MCH 29.4 26.0 - 34.0 pg   MCHC 32.9 30.0 - 36.0 g/dL   RDW 40.9 81.1 - 91.4 %   Platelets 272 150 - 400 K/uL   Neutrophils Relative % 80 (H) 43 - 77 %   Neutro Abs 10.9 (H) 1.7 - 7.7 K/uL   Lymphocytes Relative 9 (L) 12 - 46 %   Lymphs Abs 1.2 0.7 - 4.0 K/uL   Monocytes Relative 11 3 - 12 %   Monocytes Absolute 1.5 (H) 0.1 - 1.0 K/uL   Eosinophils Relative 0 0 - 5 %   Eosinophils Absolute 0.0 0.0 - 0.7 K/uL   Basophils Relative 0 0 - 1 %   Basophils Absolute 0.0 0.0 - 0.1 K/uL  Comprehensive metabolic panel  Result Value Ref Range   Sodium 132 (L) 135 - 145 mmol/L   Potassium 4.3 3.5 - 5.1 mmol/L   Chloride 96 (L) 101 - 111 mmol/L   CO2 26 22 - 32 mmol/L   Glucose, Bld 179 (H) 65 - 99 mg/dL   BUN 10 6 - 20 mg/dL   Creatinine, Ser 7.82 0.44 - 1.00 mg/dL   Calcium 8.6 (L) 8.9 - 10.3 mg/dL   Total Protein 8.0 6.5 - 8.1 g/dL   Albumin 3.4 (L) 3.5 - 5.0 g/dL   AST 29 15 - 41 U/L   ALT 42 14 - 54 U/L   Alkaline Phosphatase 70 38 - 126 U/L   Total Bilirubin 0.4 0.3 - 1.2 mg/dL   GFR calc  non Af Amer >60 >60 mL/min   GFR calc Af Amer >60 >60 mL/min   Anion gap 10 5 - 15  Lipase, blood  Result Value Ref Range   Lipase 16 (L) 22 - 51 U/L   Ct Abdomen Pelvis W Contrast 01/25/2015   CLINICAL DATA:  Upper abdominal pain for 2 weeks  EXAM: CT ABDOMEN AND PELVIS WITH CONTRAST  TECHNIQUE: Multidetector CT imaging of the abdomen and pelvis was performed using the standard protocol following bolus administration of  intravenous contrast.  CONTRAST:  25mL OMNIPAQUE IOHEXOL 300 MG/ML SOLN, OMNIPAQUE IOHEXOL 300 MG/ML SOLN  COMPARISON:  None.  FINDINGS: The gallbladder is markedly distended. There is wall thickening. There are inflammatory changes about the gallbladder. Findings are compatible with acute cholecystitis.  There is also noted to be significant intrahepatic biliary dilatation. The common bile duct is also dilated up to 13 mm. There is no obvious pancreatic head mass. There are no definite ductal stones.  Spleen, remainder of the pancreas, adrenal glands, and kidneys are within normal limits.  Small periportal and gastrohepatic ligament nodes are present. Para-aortic nodes are noted but none are greater than 1 cm in short axis diameter  Retro aortic left renal vein anatomy.  Normal appendix.  Bladder, uterus, and adnexa are unremarkable  Sigmoid diverticulosis without evidence of acute diverticulitis.  Advanced degenerative disc disease at L4-5 and L5-S1 with posterior disc osteophytes and vacuum. Concentric disc bulge at L3-4 is noted.  IMPRESSION: Findings above of compatible with acute cholecystitis. The gallbladder is dilated.  There is intra and extrahepatic biliary dilatation should testing biliary obstruction. There is no obvious common duct stone or pancreatic head mass. ERCP or MRCP would be helpful to further characterize.   Electronically Signed   By: Jolaine Click M.D.   On: 01/25/2015 11:16   US Abdomen Limited Ruq 01/25/2015   CLINICAL DATA:  Acute right upper quadrant  abdominal pain.  EXAM: US ABDOMEN LIMITED - RIGHT UPPER QUADRANT  COMPARISON:  CT scan of same day.  FINDINGS: Gallbladder:  Cholelithiasis is noted with moderate gallbladder wall thickening measuring approximately 7 mm. No sonographic Murphy's sign is noted. No pericholecystic fluid is noted.  Common bile duct:  Diameter: 12 mm which is abnormally dilated.  Liver:  Intrahepatic ductal dilatation is noted. Within normal limits in parenchymal echogenicity.  IMPRESSION: Cholelithiasis is noted with significant gallbladder wall thickening concerning for acute cholecystitis. Significant intrahepatic and extrahepatic biliary dilatation is noted concerning for distal common bile duct obstruction.   Electronically Signed   By: Lupita Raider, M.D.   On: 01/25/2015 13:26    1715:  Pt seen in ED earlier today and left before admission to take care of home issues. Pt has returned for admission now. T/C to [facility] Dr. Konrad Dolores, case discussed, including:  HPI, pertinent PM/SHx, VS/PE, dx testing, ED course and treatment:  Agreeable to admit, requests to write temporary orders, obtain medical bed to team APAdmits.   Samuel Jester, DO 01/27/15 [phone]

## 2015-01-26 ENCOUNTER — Observation Stay (HOSPITAL_COMMUNITY): Payer: Medicare HMO

## 2015-01-26 DIAGNOSIS — K838 Other specified diseases of biliary tract: Secondary | ICD-10-CM | POA: Insufficient documentation

## 2015-01-26 DIAGNOSIS — K830 Cholangitis: Secondary | ICD-10-CM | POA: Diagnosis not present

## 2015-01-26 DIAGNOSIS — Z7982 Long term (current) use of aspirin: Secondary | ICD-10-CM | POA: Diagnosis not present

## 2015-01-26 DIAGNOSIS — E780 Pure hypercholesterolemia: Secondary | ICD-10-CM | POA: Diagnosis present

## 2015-01-26 DIAGNOSIS — K810 Acute cholecystitis: Secondary | ICD-10-CM | POA: Diagnosis not present

## 2015-01-26 DIAGNOSIS — K8066 Calculus of gallbladder and bile duct with acute and chronic cholecystitis without obstruction: Secondary | ICD-10-CM | POA: Diagnosis not present

## 2015-01-26 DIAGNOSIS — Z8249 Family history of ischemic heart disease and other diseases of the circulatory system: Secondary | ICD-10-CM | POA: Diagnosis not present

## 2015-01-26 DIAGNOSIS — K8050 Calculus of bile duct without cholangitis or cholecystitis without obstruction: Secondary | ICD-10-CM | POA: Insufficient documentation

## 2015-01-26 DIAGNOSIS — E871 Hypo-osmolality and hyponatremia: Secondary | ICD-10-CM | POA: Diagnosis not present

## 2015-01-26 DIAGNOSIS — E785 Hyperlipidemia, unspecified: Secondary | ICD-10-CM | POA: Diagnosis not present

## 2015-01-26 DIAGNOSIS — K219 Gastro-esophageal reflux disease without esophagitis: Secondary | ICD-10-CM | POA: Diagnosis not present

## 2015-01-26 DIAGNOSIS — D649 Anemia, unspecified: Secondary | ICD-10-CM | POA: Diagnosis present

## 2015-01-26 DIAGNOSIS — R739 Hyperglycemia, unspecified: Secondary | ICD-10-CM | POA: Diagnosis present

## 2015-01-26 DIAGNOSIS — K449 Diaphragmatic hernia without obstruction or gangrene: Secondary | ICD-10-CM | POA: Diagnosis present

## 2015-01-26 DIAGNOSIS — R1011 Right upper quadrant pain: Secondary | ICD-10-CM | POA: Diagnosis present

## 2015-01-26 LAB — COMPREHENSIVE METABOLIC PANEL
ALBUMIN: 2.6 g/dL — AB (ref 3.5–5.0)
ALT: 28 U/L (ref 14–54)
AST: 20 U/L (ref 15–41)
Alkaline Phosphatase: 54 U/L (ref 38–126)
Anion gap: 7 (ref 5–15)
BILIRUBIN TOTAL: 0.8 mg/dL (ref 0.3–1.2)
BUN: 10 mg/dL (ref 6–20)
CALCIUM: 7.9 mg/dL — AB (ref 8.9–10.3)
CO2: 27 mmol/L (ref 22–32)
CREATININE: 0.77 mg/dL (ref 0.44–1.00)
Chloride: 101 mmol/L (ref 101–111)
GFR calc non Af Amer: >60 mL/min (ref >60)
Glucose, Bld: 97 mg/dL (ref 65–99)
POTASSIUM: 4.5 mmol/L (ref 3.5–5.1)
SODIUM: 135 mmol/L (ref 135–145)
TOTAL PROTEIN: 6.5 g/dL (ref 6.5–8.1)

## 2015-01-26 LAB — CBC
HEMATOCRIT: 32.3 % — AB (ref 36.0–46.0)
HEMOGLOBIN: 10.4 g/dL — AB (ref 12.0–15.0)
MCH: 29.5 pg (ref 26.0–34.0)
MCHC: 32.2 g/dL (ref 30.0–36.0)
MCV: 91.5 fL (ref 78.0–100.0)
Platelets: 238 10*3/uL (ref 150–400)
RBC: 3.53 MIL/uL — AB (ref 3.87–5.11)
RDW: 13 % (ref 11.5–15.5)
WBC: 15.3 10*3/uL — AB (ref 4.0–10.5)

## 2015-01-26 LAB — PROTIME-INR
INR: 1.46 (ref 0.00–1.49)
Prothrombin Time: 17.8 seconds — ABNORMAL HIGH (ref 11.6–15.2)

## 2015-01-26 LAB — APTT: aPTT: 34 seconds (ref 24–37)

## 2015-01-26 MED ORDER — SODIUM CHLORIDE 0.9 % IV SOLN
1.5000 g | Freq: Four times a day (QID) | INTRAVENOUS | Status: DC
  Administered 2015-01-26 – 2015-01-27 (×3): 1.5 g via INTRAVENOUS
  Filled 2015-01-26 (×16): qty 1.5

## 2015-01-26 MED ORDER — BOOST / RESOURCE BREEZE PO LIQD
1.0000 | Freq: Two times a day (BID) | ORAL | Status: DC
  Administered 2015-01-26: 1 via ORAL

## 2015-01-26 MED ORDER — PRO-STAT SUGAR FREE PO LIQD
30.0000 mL | Freq: Two times a day (BID) | ORAL | Status: DC
  Administered 2015-01-26 (×2): 30 mL via ORAL
  Filled 2015-01-26 (×2): qty 30

## 2015-01-26 MED ORDER — GADOBENATE DIMEGLUMINE 529 MG/ML IV SOLN
17.0000 mL | Freq: Once | INTRAVENOUS | Status: AC | PRN
  Administered 2015-01-26: 17 mL via INTRAVENOUS

## 2015-01-26 MED ORDER — LORAZEPAM 2 MG/ML IJ SOLN
1.0000 mg | Freq: Once | INTRAMUSCULAR | Status: AC
  Administered 2015-01-26: 1 mg via INTRAVENOUS
  Filled 2015-01-26: qty 1

## 2015-01-26 NOTE — Progress Notes (Signed)
Initial Nutrition Assessment  INTERVENTION:  Boost Breeze po BID, each supplement provides 250 kcal and 9 grams of protein   ProStat 30 ml BID (each 30 ml provides 100 kcal, 15 gr protein)    NUTRITION DIAGNOSIS:   Inadequate oral intake related to altered GI function as evidenced by NPO status.   GOAL:   Patient will meet greater than or equal to 90% of their needs   MONITOR:   Diet advancement, PO intake, Weight trends  REASON FOR ASSESSMENT:   Malnutrition Screening Tool    ASSESSMENT: Pt admission with abdominal pain which has been persisting the past 2 weeks. Diet hx: po intake has been limited to soft foods such as grits, yogurt, mashed potatoes and she's been drinking Ensure.   Weight is stable: 99% of usual body weight.  Nutrition focused exam: WDL   MRCP findings: Acute cholecystitis. An ERCP with stone extraction pending for 7/29.    Diet Order:  Diet clear liquid Room service appropriate?: Yes; Fluid consistency:: Thin Diet NPO time specified Except for: Sips with Meds  Skin:   WDL  Last BM:   7/27   Height:   Ht Readings from Last 1 Encounters:  01/25/15  (1.626 m)    Weight:   Wt Readings from Last 1 Encounters:  01/25/15 180 lb 4.8 oz (81.784 kg)    Ideal Body Weight:  54.5 kg  BMI:  Body mass index is 30.93 kg/(m^2). Obesity class I  Estimated Nutritional Needs:   Kcal:  1476-1600  Protein:  80-90 gr  Fluid:  >1500 ml daily  EDUCATION NEEDS:   No education needs identified at this time  Royann Shivers MS,RD,CSG,LDN Office: #[phone] Pager: [phone]

## 2015-01-26 NOTE — Progress Notes (Signed)
MRCP shows a distal common bile duct stone. Dr. Darrick Penna of gastroenterology has been consulted. She will undergo ERCP with stone extraction tomorrow. Patient would like to have a laparoscopic cholecystectomy as an outpatient, which is fine with me. She may be discharged after ERCP and will follow-up in my office.

## 2015-01-26 NOTE — Consult Note (Signed)
Referring Provider: No ref. provider found Primary Care Physician:  Alice Reichert, MD Primary Gastroenterologist:  Dr. Jena Gauss  Date of Admission: 01/25/15 Date of Consultation: 01/26/15  Reason for Consultation:  CBD stone  HPI:  69 year old female presented to emergency room with right upper quadrant abdominal pain which have been occurring for 2 weeks intermittently. She is also having some loose stools. She was started on a PPI and Carafate without improvement in her symptoms were worse with meals and not relieved by anything. She was found to have acute cholecystitis on ultrasound and CT. Surgery was consult and the patient subsequently admitted for surgery. Right upper quadrant ultrasound found significant intrahepatic and extrahepatic biliary dilation concerning for distal common bile duct obstruction. An MRI/MRCP was subsequently ordered and found a 1.5 cm dilated common bile duct and a stone within the distal common bile duct measuring 7 mm. GI was subsequently consulted.  Patient states she currently feels much better than she did. Some residual RUQ abdominal pain. No nausea/vomiting. Continued loose stools. Denies hematochezia/melena. Denies chest pain, dyspnea, lightheadedness, dizziness, subjective fevers. Denies any further upper to lower GI symptoms.  Past Medical History  Diagnosis Date  . Hypercholesteremia   . E coli enteritis   . Hiatal hernia     History reviewed. No pertinent past surgical history.  Prior to Admission medications   Medication Sig Start Date End Date Taking? Authorizing Provider  alum & mag hydroxide-simeth (MAALOX PLUS) 400-400-40 MG/5ML suspension Take 15 mLs by mouth every 6 (six) hours as needed for indigestion.   Yes Historical Provider, MD  aspirin EC 81 MG tablet Take 81 mg by mouth daily.   Yes Historical Provider, MD  co-enzyme Q-10 30 MG capsule Take 30 mg by mouth 3 (three) times daily.   Yes Historical Provider, MD  dicyclomine (BENTYL) 10  MG capsule Take 10 mg by mouth 4 (four) times daily -  before meals and at bedtime.   Yes Historical Provider, MD  Multiple Vitamin (MULTIVITAMIN WITH MINERALS) TABS tablet Take 1 tablet by mouth daily.   Yes Historical Provider, MD  multivitamin-lutein (OCUVITE-LUTEIN) CAPS capsule Take 1 capsule by mouth daily.   Yes Historical Provider, MD  pantoprazole (PROTONIX) 40 MG tablet Take 40 mg by mouth daily.   Yes Historical Provider, MD  simvastatin (ZOCOR) 20 MG tablet Take 20 mg by mouth daily.   Yes Historical Provider, MD  sucralfate (CARAFATE) 1 GM/10ML suspension Take 1 g by mouth 4 (four) times daily -  with meals and at bedtime. Starting 01/20/2015 x 10 days.   Yes Historical Provider, MD    Current Facility-Administered Medications  Medication Dose Route Frequency Provider Last Rate Last Dose  . 0.9 %  sodium chloride infusion   Intravenous Continuous Samuel Jester, DO 100 mL/hr at 01/25/15 1723    . 0.9 %  sodium chloride infusion   Intravenous Continuous Ozella Rocks, MD 100 mL/hr at 01/25/15 2024    . acetaminophen (TYLENOL) tablet 650 mg  650 mg Oral Q6H PRN Ozella Rocks, MD   650 mg at 01/26/15 4098   Or  . acetaminophen (TYLENOL) suppository 650 mg  650 mg Rectal Q6H PRN Ozella Rocks, MD      . ampicillin-sulbactam (UNASYN) 1.5 g in sodium chloride 0.9 % 50 mL IVPB  1.5 g Intravenous Q6H Sandi L Fields, MD      . feeding supplement (BOOST / RESOURCE BREEZE) liquid 1 Container  1 Container Oral BID BM Wanda L  Weisner, RD   1 Container at 01/26/15 1454  . feeding supplement (PRO-STAT SUGAR FREE 64) liquid 30 mL  30 mL Oral BID Marshell Levan, RD   30 mL at 01/26/15 1454  . morphine 4 MG/ML injection 4 mg  4 mg Intravenous Q4H PRN Ozella Rocks, MD   4 mg at 01/25/15 2020  . ondansetron (ZOFRAN) tablet 4 mg  4 mg Oral Q6H PRN Ozella Rocks, MD       Or  . ondansetron [facility]) injection 4 mg  4 mg Intravenous Q6H PRN Ozella Rocks, MD      . pantoprazole (PROTONIX)  EC tablet 40 mg  40 mg Oral Daily Ozella Rocks, MD   40 mg at 01/26/15 1036    Allergies as of 01/25/2015 - Review Complete 01/25/2015  Allergen Reaction Noted  . Sulfonamide derivatives Nausea And Vomiting     Family History  Problem Relation Age of Onset  . Heart disease Mother     History   Social History  . Marital Status: Divorced    Spouse Name: N/A  . Number of Children: N/A  . Years of Education: N/A   Occupational History  . Not on file.   Social History Main Topics  . Smoking status: Never Smoker   . Smokeless tobacco: Not on file  . Alcohol Use: No  . Drug Use: Not on file  . Sexual Activity: Not on file   Other Topics Concern  . Not on file   Social History Narrative    Review of Systems: 10 point ROS negative except as per HPI  Physical Exam: Vital signs in last 24 hours: Temp:  [98.3 F (36.8 C)-102.3 F (39.1 C)] 98.7 F (37.1 C) (07/28 1536) Pulse Rate:  [79-105] 94 (07/28 1536) Resp:  [18] 18 (07/28 1536) BP: (94-131)/(39-82) 131/48 mmHg (07/28 1536) SpO2:  [92 %-99 %] 99 % (07/28 1536) Weight:  [175 lb (79.379 kg)-180 lb 4.8 oz (81.784 kg)] 180 lb (81.647 kg) (07/28 0946)   General:   Alert,  Well-developed, well-nourished, pleasant and cooperative in NAD Head:  Normocephalic and atraumatic. Eyes:  Sclera clear, no icterus. Conjunctiva pink. Ears:  Normal auditory acuity. Neck:  Supple; no masses or thyromegaly. Lungs:  Clear throughout to auscultation. No wheezes, crackles, or rhonchi. No acute distress. Heart:  Regular rate and rhythm; no murmurs, clicks, rubs,  or gallops. Abdomen:  Rounded, soft, and nondistended. Increased RUQ TTP. No masses, hepatosplenomegaly or hernias noted. Normal bowel sounds, without guarding, and without rebound.   Rectal:  Deferred.   Extremities:  Without clubbing or edema. Neurologic:  Alert and  oriented x4;  grossly normal neurologically. Skin:  Intact without significant lesions or rashes. Psych:   Alert and cooperative. Normal mood and affect.  Intake/Output from previous day: 07/27 0701 - 07/28 0700 In: 240 [P.O.:240] Out: -  Intake/Output this shift:    Lab Results:  Recent Labs  01/25/15 0915 01/26/15 0521  WBC 13.6* 15.3*  HGB 12.5 10.4*  HCT 38.0 32.3*  PLT 272 238   BMET  Recent Labs  01/25/15 0915 01/26/15 0521  NA 132* 135  K 4.3 4.5  CL 96* 101  CO2 26 27  GLUCOSE 179* 97  BUN 10 10  CREATININE 0.69 0.77  CALCIUM 8.6* 7.9*   LFT  Recent Labs  01/25/15 0915 01/26/15 0521  PROT 8.0 6.5  ALBUMIN 3.4* 2.6*  AST 29 20  ALT 42 28  ALKPHOS 70  54  BILITOT 0.4 0.8   PT/INR  Recent Labs  01/26/15 0521  LABPROT 17.8*  INR 1.46   Hepatitis Panel No results for input(s): HEPBSAG, HCVAB, HEPAIGM, HEPBIGM in the last 72 hours. C-Diff No results for input(s): CDIFFTOX in the last 72 hours.  Studies/Results: Ct Abdomen Pelvis W Contrast  01/25/2015   CLINICAL DATA:  Upper abdominal pain for 2 weeks  EXAM: CT ABDOMEN AND PELVIS WITH CONTRAST  TECHNIQUE: Multidetector CT imaging of the abdomen and pelvis was performed using the standard protocol following bolus administration of intravenous contrast.  CONTRAST:  25mL OMNIPAQUE IOHEXOL 300 MG/ML SOLN, OMNIPAQUE IOHEXOL 300 MG/ML SOLN  COMPARISON:  None.  FINDINGS: The gallbladder is markedly distended. There is wall thickening. There are inflammatory changes about the gallbladder. Findings are compatible with acute cholecystitis.  There is also noted to be significant intrahepatic biliary dilatation. The common bile duct is also dilated up to 13 mm. There is no obvious pancreatic head mass. There are no definite ductal stones.  Spleen, remainder of the pancreas, adrenal glands, and kidneys are within normal limits.  Small periportal and gastrohepatic ligament nodes are present. Para-aortic nodes are noted but none are greater than 1 cm in short axis diameter  Retro aortic left renal vein anatomy.   Normal appendix.  Bladder, uterus, and adnexa are unremarkable  Sigmoid diverticulosis without evidence of acute diverticulitis.  Advanced degenerative disc disease at L4-5 and L5-S1 with posterior disc osteophytes and vacuum. Concentric disc bulge at L3-4 is noted.  IMPRESSION: Findings above of compatible with acute cholecystitis. The gallbladder is dilated.  There is intra and extrahepatic biliary dilatation should testing biliary obstruction. There is no obvious common duct stone or pancreatic head mass. ERCP or MRCP would be helpful to further characterize.   Electronically Signed   By: Jolaine Click M.D.   On: 01/25/2015 11:16   Mr 3d Recon At Scanner  01/26/2015   CLINICAL DATA:  Followup abnormal CT from 01/25/2015. Evaluate for choledocholithiasis  EXAM: MRI ABDOMEN WITHOUT AND WITH CONTRAST (INCLUDING MRCP)  TECHNIQUE: Multiplanar multisequence MR imaging of the abdomen was performed both before and after the administration of intravenous contrast. Heavily T2-weighted images of the biliary and pancreatic ducts were obtained, and three-dimensional MRCP images were rendered by post processing.  CONTRAST:  17mL MULTIHANCE GADOBENATE DIMEGLUMINE 529 MG/ML IV SOLN  COMPARISON:  01/25/2015  FINDINGS: Hepatobiliary: There is diffuse gallbladder wall thickening and inflammation. Gallbladder wall thickness measures 6 mm. Numerous stones are identified within the lumen of the gallbladder measuring up to 1.5 cm. Intrahepatic bile duct dilatation is identified. The common bile duct is increased in caliber measuring 1.5 cm, image 26/ series 3. There is a stone within the distal common bile duct which measures 7 mm, image 5/series 5  No suspicious liver abnormality identified.  Pancreas: Normal appearance of the pancreas.  Spleen: The spleen is unremarkable.  Adrenals/Urinary Tract: The adrenal glands are both normal. Unremarkable appearance of both kidneys.  Stomach/Bowel: The stomach appears within normal limits.  The visualized upper abdominal bowel loops are unremarkable. Normal appearance of the visualized portions of colon.  Vascular/Lymphatic: Normal caliber of the abdominal aorta. No aneurysm. Prominent upper abdominal retroperitoneal lymph nodes are identified. Index aortocaval node measures 1 cm, image 33/series 3. Index periaortic lymph node measures 9 mm, image 31/series 3.  Other: No free fluid or fluid collections identified.  Musculoskeletal: There is normal signal from within the bone marrow.  IMPRESSION: 1. Acute cholecystitis.  2. Increase caliber of the common bile duct and intrahepatic ducts with distal common bile duct stone measuring 7 mm. 3. Prominent upper abdominal lymph nodes. In the acute setting these are likely reactive secondary to gallbladder inflammation. These results will be called to the ordering clinician or representative by the Radiologist Assistant, and communication documented in the PACS or zVision Dashboard.   Electronically Signed   By: Signa Kell M.D.   On: 01/26/2015 11:07   Mr Abd W/wo Cm/mrcp  01/26/2015   CLINICAL DATA:  Followup abnormal CT from 01/25/2015. Evaluate for choledocholithiasis  EXAM: MRI ABDOMEN WITHOUT AND WITH CONTRAST (INCLUDING MRCP)  TECHNIQUE: Multiplanar multisequence MR imaging of the abdomen was performed both before and after the administration of intravenous contrast. Heavily T2-weighted images of the biliary and pancreatic ducts were obtained, and three-dimensional MRCP images were rendered by post processing.  CONTRAST:  17mL MULTIHANCE GADOBENATE DIMEGLUMINE 529 MG/ML IV SOLN  COMPARISON:  01/25/2015  FINDINGS: Hepatobiliary: There is diffuse gallbladder wall thickening and inflammation. Gallbladder wall thickness measures 6 mm. Numerous stones are identified within the lumen of the gallbladder measuring up to 1.5 cm. Intrahepatic bile duct dilatation is identified. The common bile duct is increased in caliber measuring 1.5 cm, image 26/ series 3.  There is a stone within the distal common bile duct which measures 7 mm, image 5/series 5  No suspicious liver abnormality identified.  Pancreas: Normal appearance of the pancreas.  Spleen: The spleen is unremarkable.  Adrenals/Urinary Tract: The adrenal glands are both normal. Unremarkable appearance of both kidneys.  Stomach/Bowel: The stomach appears within normal limits. The visualized upper abdominal bowel loops are unremarkable. Normal appearance of the visualized portions of colon.  Vascular/Lymphatic: Normal caliber of the abdominal aorta. No aneurysm. Prominent upper abdominal retroperitoneal lymph nodes are identified. Index aortocaval node measures 1 cm, image 33/series 3. Index periaortic lymph node measures 9 mm, image 31/series 3.  Other: No free fluid or fluid collections identified.  Musculoskeletal: There is normal signal from within the bone marrow.  IMPRESSION: 1. Acute cholecystitis. 2. Increase caliber of the common bile duct and intrahepatic ducts with distal common bile duct stone measuring 7 mm. 3. Prominent upper abdominal lymph nodes. In the acute setting these are likely reactive secondary to gallbladder inflammation. These results will be called to the ordering clinician or representative by the Radiologist Assistant, and communication documented in the PACS or zVision Dashboard.   Electronically Signed   By: Signa Kell M.D.   On: 01/26/2015 11:07   US Abdomen Limited Ruq  01/25/2015   CLINICAL DATA:  Acute right upper quadrant abdominal pain.  EXAM: US ABDOMEN LIMITED - RIGHT UPPER QUADRANT  COMPARISON:  CT scan of same day.  FINDINGS: Gallbladder:  Cholelithiasis is noted with moderate gallbladder wall thickening measuring approximately 7 mm. No sonographic Murphy's sign is noted. No pericholecystic fluid is noted.  Common bile duct:  Diameter: 12 mm which is abnormally dilated.  Liver:  Intrahepatic ductal dilatation is noted. Within normal limits in parenchymal echogenicity.   IMPRESSION: Cholelithiasis is noted with significant gallbladder wall thickening concerning for acute cholecystitis. Significant intrahepatic and extrahepatic biliary dilatation is noted concerning for distal common bile duct obstruction.   Electronically Signed   By: Lupita Raider, M.D.   On: 01/25/2015 13:26    Impression: 69 year old female with acute cholecystitis evaluated by surgery. Patient with CBD dilation and subsequent 7 mm CBD stone on MRI/MRCP. Symptomatically improved, stable and non-toxic. Afebrile  today. Vital signs stable. Some residual abdominal pain to RUQ. Patient is wanting to go home and proceed with lap chole as an outpatient. Blood cultures no growth to date. INR normal. Mild anemia H/H today 10.4/32.3. Liver function normal on CMP.   Plan: 1. NPO after midnight 2. ERCP as already scheduled for tomorrow 3. Follow-up with surgery.    Wynne Dust, AGNP-C Adult & Gerontological Nurse Practitioner [facility]    LOS: 0 days     01/26/2015, 3:44 PM

## 2015-01-26 NOTE — Progress Notes (Signed)
Subjective: The patient is more comfortable this morning but still has upper abdominal l pain. She was admitted with diagnosis of cholelithiasis cholecystitis. She's been seen by surgery  Objective: Vital signs in last 24 hours: Temp:  [99.2 F (37.3 C)-102.3 F (39.1 C)] 102.3 F (39.1 C) (07/28 0500) Pulse Rate:  [88-105] 89 (07/28 0500) Resp:  [16-20] 18 (07/28 0500) BP: (95-157)/(39-85) 95/39 mmHg (07/28 0500) SpO2:  [83 %-100 %] 93 % (07/28 0500) Weight:  [79.379 kg (175 lb)-81.784 kg (180 lb 4.8 oz)] 81.784 kg (180 lb 4.8 oz) (07/27 1845) Weight change:     Intake/Output from previous day: 07/27 0701 - 07/28 0700 In: 240 [P.O.:240] Out: -  Intake/Output this shift: Total I/O In: 240 [P.O.:240] Out: -   Physical Exam: Gen. appearance patient is alert and oriented  HEENT negative  Neck supple no JVD or thyroid abnormalities  Lungs clear to P&A  Heart regular rhythm no murmurs  Abdomen tenderness in right upper quadrant and slight tenderness in epigastrium  Extremities free of edema   Recent Labs  01/25/15 0915  WBC 13.6*  HGB 12.5  HCT 38.0  PLT 272   BMET  Recent Labs  01/25/15 0915  NA 132*  K 4.3  CL 96*  CO2 26  GLUCOSE 179*  BUN 10  CREATININE 0.69  CALCIUM 8.6*    Studies/Results: Ct Abdomen Pelvis W Contrast  01/25/2015   CLINICAL DATA:  Upper abdominal pain for 2 weeks  EXAM: CT ABDOMEN AND PELVIS WITH CONTRAST  TECHNIQUE: Multidetector CT imaging of the abdomen and pelvis was performed using the standard protocol following bolus administration of intravenous contrast.  CONTRAST:  25mL OMNIPAQUE IOHEXOL 300 MG/ML SOLN, OMNIPAQUE IOHEXOL 300 MG/ML SOLN  COMPARISON:  None.  FINDINGS: The gallbladder is markedly distended. There is wall thickening. There are inflammatory changes about the gallbladder. Findings are compatible with acute cholecystitis.  There is also noted to be significant intrahepatic biliary dilatation. The common  bile duct is also dilated up to 13 mm. There is no obvious pancreatic head mass. There are no definite ductal stones.  Spleen, remainder of the pancreas, adrenal glands, and kidneys are within normal limits.  Small periportal and gastrohepatic ligament nodes are present. Para-aortic nodes are noted but none are greater than 1 cm in short axis diameter  Retro aortic left renal vein anatomy.  Normal appendix.  Bladder, uterus, and adnexa are unremarkable  Sigmoid diverticulosis without evidence of acute diverticulitis.  Advanced degenerative disc disease at L4-5 and L5-S1 with posterior disc osteophytes and vacuum. Concentric disc bulge at L3-4 is noted.  IMPRESSION: Findings above of compatible with acute cholecystitis. The gallbladder is dilated.  There is intra and extrahepatic biliary dilatation should testing biliary obstruction. There is no obvious common duct stone or pancreatic head mass. ERCP or MRCP would be helpful to further characterize.   Electronically Signed   By: Jolaine Click M.D.   On: 01/25/2015 11:16   US Abdomen Limited Ruq  01/25/2015   CLINICAL DATA:  Acute right upper quadrant abdominal pain.  EXAM: US ABDOMEN LIMITED - RIGHT UPPER QUADRANT  COMPARISON:  CT scan of same day.  FINDINGS: Gallbladder:  Cholelithiasis is noted with moderate gallbladder wall thickening measuring approximately 7 mm. No sonographic Murphy's sign is noted. No pericholecystic fluid is noted.  Common bile duct:  Diameter: 12 mm which is abnormally dilated.  Liver:  Intrahepatic ductal dilatation is noted. Within normal limits in parenchymal echogenicity.  IMPRESSION: Cholelithiasis  is noted with significant gallbladder wall thickening concerning for acute cholecystitis. Significant intrahepatic and extrahepatic biliary dilatation is noted concerning for distal common bile duct obstruction.   Electronically Signed   By: Lupita Raider, M.D.   On: 01/25/2015 13:26    Medications:  . sodium chloride   Intravenous  STAT  . aspirin EC  81 mg Oral Daily  . ciprofloxacin  400 mg Intravenous Q12H  . heparin  5,000 Units Subcutaneous 3 times per day  . pantoprazole  40 mg Oral Daily  . simvastatin  20 mg Oral q1800    . sodium chloride 100 mL/hr at 01/25/15 1723  . sodium chloride 100 mL/hr at 01/25/15 2024     Assessment/Plan: 1. Cholecystitis cholelithiasis-patient remains nothing by mouth pending surgery-patient's scheduled for MRCP. Continue IV fluids and IV ciprofloxacin     Rogelio Winbush G 01/26/2015, 6:45 AM

## 2015-01-26 NOTE — Care Management Note (Signed)
Case Management Note  Patient Details  Name: DELISA FINCK MRN: [id number] Date of Birth: 09/17/1945  Expected Discharge Date:                  Expected Discharge Plan:  Home/Self Care  In-House Referral:  NA  Discharge planning Services  CM Consult  Post Acute Care Choice:  NA Choice offered to:  NA  DME Arranged:    DME Agency:     HH Arranged:    HH Agency:     Status of Service:  Completed, signed off  Medicare Important Message Given:    Date Medicare IM Given:    Medicare IM give by:    Date Additional Medicare IM Given:    Additional Medicare Important Message give by:     If discussed at Long Length of Stay Meetings, dates discussed:    Additional Comments: Pt is from home, lives alone and is independent at baseline. Pt has family to help if she needs them too. Pt has no HH services or DME needs prior to admission. Pt plans to discharge home with self care. No CM needs.  Malcolm Metro, RN 01/26/2015, 3:32 PM

## 2015-01-27 ENCOUNTER — Encounter (HOSPITAL_COMMUNITY)
Admission: EM | Disposition: A | Discharge status: Home / Self Care | Payer: Self-pay | Source: Home / Self Care | Attending: Family Medicine

## 2015-01-27 ENCOUNTER — Inpatient Hospital Stay (HOSPITAL_COMMUNITY): Payer: Medicare HMO

## 2015-01-27 ENCOUNTER — Inpatient Hospital Stay (HOSPITAL_COMMUNITY): Payer: Medicare HMO | Admitting: Anesthesiology

## 2015-01-27 ENCOUNTER — Encounter (HOSPITAL_COMMUNITY): Payer: Self-pay | Admitting: *Deleted

## 2015-01-27 DIAGNOSIS — K830 Cholangitis: Secondary | ICD-10-CM

## 2015-01-27 DIAGNOSIS — K8309 Other cholangitis: Secondary | ICD-10-CM | POA: Insufficient documentation

## 2015-01-27 HISTORY — PX: ERCP: SHX5425

## 2015-01-27 HISTORY — PX: REMOVAL OF STONES: SHX5545

## 2015-01-27 HISTORY — PX: SPHINCTEROTOMY: SHX5544

## 2015-01-27 LAB — HEMOGLOBIN A1C
Hgb A1c MFr Bld: 6 % — ABNORMAL HIGH (ref 4.8–5.6)
Mean Plasma Glucose: 126 mg/dL

## 2015-01-27 SURGERY — ERCP, WITH INTERVENTION IF INDICATED
Anesthesia: General

## 2015-01-27 MED ORDER — GLYCOPYRROLATE 0.2 MG/ML IJ SOLN
INTRAMUSCULAR | Status: DC | PRN
  Administered 2015-01-27: 0.4 mg via INTRAVENOUS

## 2015-01-27 MED ORDER — LIDOCAINE HCL 1 % IJ SOLN
INTRAMUSCULAR | Status: DC | PRN
Start: 2015-01-27 — End: 2015-01-27
  Administered 2015-01-27: 30 mg via INTRADERMAL

## 2015-01-27 MED ORDER — ONDANSETRON HCL 4 MG/2ML IJ SOLN
4.0000 mg | Freq: Once | INTRAMUSCULAR | Status: AC
  Administered 2015-01-27: 4 mg via INTRAVENOUS

## 2015-01-27 MED ORDER — LIDOCAINE HCL (PF) 1 % IJ SOLN
INTRAMUSCULAR | Status: AC
  Filled 2015-01-27: qty 5

## 2015-01-27 MED ORDER — FENTANYL CITRATE (PF) 100 MCG/2ML IJ SOLN
INTRAMUSCULAR | Status: AC
  Filled 2015-01-27: qty 2

## 2015-01-27 MED ORDER — SODIUM CHLORIDE 0.9 % IV SOLN
INTRAVENOUS | Status: AC
  Filled 2015-01-27: qty 50

## 2015-01-27 MED ORDER — GLYCOPYRROLATE 0.2 MG/ML IJ SOLN
INTRAMUSCULAR | Status: AC
  Filled 2015-01-27: qty 2

## 2015-01-27 MED ORDER — GLUCAGON HCL RDNA (DIAGNOSTIC) 1 MG IJ SOLR
INTRAMUSCULAR | Status: DC | PRN
  Administered 2015-01-27: .5 mg via INTRAVENOUS

## 2015-01-27 MED ORDER — MIDAZOLAM HCL 2 MG/2ML IJ SOLN
1.0000 mg | INTRAMUSCULAR | Status: DC | PRN
  Administered 2015-01-27: 2 mg via INTRAVENOUS

## 2015-01-27 MED ORDER — ONDANSETRON HCL 4 MG/2ML IJ SOLN: 4.0000 mg | Freq: Once | INTRAMUSCULAR | Status: DC | PRN

## 2015-01-27 MED ORDER — FENTANYL CITRATE (PF) 100 MCG/2ML IJ SOLN: 25.0000 ug | INTRAMUSCULAR | Status: DC | PRN

## 2015-01-27 MED ORDER — SODIUM CHLORIDE 0.9 % IV SOLN
INTRAVENOUS | Status: DC | PRN
  Administered 2015-01-27: 11:00:00

## 2015-01-27 MED ORDER — SODIUM CHLORIDE 0.9 % IV SOLN: INTRAVENOUS | Status: DC

## 2015-01-27 MED ORDER — MIDAZOLAM HCL 2 MG/2ML IJ SOLN
INTRAMUSCULAR | Status: AC
  Filled 2015-01-27: qty 2

## 2015-01-27 MED ORDER — FENTANYL CITRATE (PF) 100 MCG/2ML IJ SOLN
INTRAMUSCULAR | Status: DC | PRN
  Administered 2015-01-27 (×2): 25 ug via INTRAVENOUS

## 2015-01-27 MED ORDER — SODIUM CHLORIDE 0.9 % IJ SOLN
INTRAMUSCULAR | Status: AC
  Filled 2015-01-27: qty 10

## 2015-01-27 MED ORDER — ONDANSETRON HCL 4 MG/2ML IJ SOLN
INTRAMUSCULAR | Status: AC
  Filled 2015-01-27: qty 2

## 2015-01-27 MED ORDER — MIDAZOLAM HCL 5 MG/5ML IJ SOLN
INTRAMUSCULAR | Status: DC | PRN
  Administered 2015-01-27: 2 mg via INTRAVENOUS

## 2015-01-27 MED ORDER — SEVOFLURANE IN SOLN
RESPIRATORY_TRACT | Status: AC
  Filled 2015-01-27: qty 250

## 2015-01-27 MED ORDER — LACTATED RINGERS IV SOLN
INTRAVENOUS | Status: DC
  Administered 2015-01-27: 1000 mL via INTRAVENOUS
  Administered 2015-01-27: 12:00:00 via INTRAVENOUS

## 2015-01-27 MED ORDER — SUCCINYLCHOLINE CHLORIDE 20 MG/ML IJ SOLN
INTRAMUSCULAR | Status: DC | PRN
  Administered 2015-01-27: 150 mg via INTRAVENOUS

## 2015-01-27 MED ORDER — STERILE WATER FOR IRRIGATION IR SOLN
Status: DC | PRN
  Administered 2015-01-27: 11:00:00

## 2015-01-27 MED ORDER — PROPOFOL 10 MG/ML IV BOLUS
INTRAVENOUS | Status: DC | PRN
  Administered 2015-01-27: 140 mg via INTRAVENOUS

## 2015-01-27 MED ORDER — ROCURONIUM BROMIDE 50 MG/5ML IV SOLN
INTRAVENOUS | Status: AC
  Filled 2015-01-27: qty 1

## 2015-01-27 MED ORDER — EPHEDRINE SULFATE 50 MG/ML IJ SOLN
INTRAMUSCULAR | Status: AC
  Filled 2015-01-27: qty 1

## 2015-01-27 MED ORDER — NEOSTIGMINE METHYLSULFATE 10 MG/10ML IV SOLN
INTRAVENOUS | Status: DC | PRN
  Administered 2015-01-27 (×2): 1 mg via INTRAVENOUS

## 2015-01-27 MED ORDER — PROPOFOL 10 MG/ML IV BOLUS
INTRAVENOUS | Status: AC
  Filled 2015-01-27: qty 20

## 2015-01-27 MED ORDER — ROCURONIUM BROMIDE 100 MG/10ML IV SOLN
INTRAVENOUS | Status: DC | PRN
  Administered 2015-01-27: 20 mg via INTRAVENOUS
  Administered 2015-01-27: 5 mg via INTRAVENOUS
  Administered 2015-01-27: 10 mg via INTRAVENOUS

## 2015-01-27 SURGICAL SUPPLY — 30 items
BALLN RETRIEVAL 12X15 (BALLOONS) IMPLANT
BALLN RETRIEVAL 12X15MM (BALLOONS)
BALLN RETRIEVAL 9X12 (BALLOONS) ×1 IMPLANT
BALLN RETRIEVAL 9X12MM (BALLOONS) ×1
BALN RTRVL 200 6-7FR 12-15 (BALLOONS)
BALN RTRVL 6-7FR 9-12 ABV INJ (BALLOONS) ×1
BASKET TRAPEZOID 3X6 (MISCELLANEOUS) IMPLANT
BASKET TRAPEZOID LITHO 2.5X5 (MISCELLANEOUS) IMPLANT
BLOCK BITE 60FR ADLT L/F BLUE (MISCELLANEOUS) ×2 IMPLANT
BSKT STON RTRVL TRAPEZOID 3X6 (MISCELLANEOUS)
DEVICE INFLATION ENCORE 26 (MISCELLANEOUS) IMPLANT
DEVICE LOCKING W-BIOPSY CAP (MISCELLANEOUS) ×3 IMPLANT
ELECT REM PT RETURN 9FT ADLT (ELECTROSURGICAL)
ELECTRODE REM PT RTRN 9FT ADLT (ELECTROSURGICAL) IMPLANT
FLOOR PAD 36X40 (MISCELLANEOUS) ×3
GUIDEWIRE HYDRA JAGWIRE .35 (WIRE) IMPLANT
GUIDEWIRE JAG HINI 025X260CM (WIRE) IMPLANT
KIT ENDO PROCEDURE PEN (KITS) ×3 IMPLANT
KIT ROOM TURNOVER APOR (KITS) ×2 IMPLANT
PAD FLOOR 36X40 (MISCELLANEOUS) IMPLANT
SNARE ROTATE MED OVAL 20MM (MISCELLANEOUS) IMPLANT
SNARE SHORT THROW 13M SML OVAL (MISCELLANEOUS) IMPLANT
SPHINCTEROTOME AUTOTOME .25 (MISCELLANEOUS) IMPLANT
SPHINCTEROTOME HYDRATOME 44 (MISCELLANEOUS) ×3 IMPLANT
SYR 20CC LL (SYRINGE) IMPLANT
SYR 50ML LL SCALE MARK (SYRINGE) ×2 IMPLANT
SYSTEM CONTINUOUS INJECTION (MISCELLANEOUS) ×3 IMPLANT
WALLSTENT METAL COVERED 10X60 (STENTS) IMPLANT
WALLSTENT METAL COVERED 10X80 (STENTS) IMPLANT
WATER STERILE IRR 1000ML POUR (IV SOLUTION) ×2 IMPLANT

## 2015-01-27 NOTE — Care Management Important Message (Signed)
Important Message  Patient Details  Name: Melissa Bullock MRN: [id number] Date of Birth: [birth year]   Medicare Important Message Given:  Yes-second notification given    Cheryl Flash, RN 01/27/2015, 7:54 AM

## 2015-01-27 NOTE — Discharge Instructions (Signed)
You had an ERCP TODAY. DR. Darrick Penna CUT THE OPENING OF YOUR BILE DUCT AND REMOVED THE STONE.   STRICTLY AVOID ASPIRIN, BC/GODDY POWDERS, IBUPROFEN/MOTRIN, OR NAPROXEN/ALEVE UNTIL AUG 8.  FOLLOW A LOW FAT DIET TO PREVENT ABDOMINAL PAIN AND TO PREVENT YOUR GALLBLADDER FROM CONTRACTING AND RELEASING MORE STONES. SEE INFO BELOW.  FOLLOW UP WITH DR.JENKINS TO HAVE YOUR GALLBLADDER REMOVED.   ENDOSCOPY AFTER CARE Read the instructions outlined below and refer to this sheet in the next week. These discharge instructions provide you with general information on caring for yourself after you leave the hospital. While your treatment has been planned according to the most current medical practices available, unavoidable complications occasionally occur. If you have any problems or questions after discharge, call DR. Shaquitta Burbridge, [phone].  ACTIVITY  You may resume your regular activity, but move at a slower pace for the next 24 hours.   Take frequent rest periods for the next 24 hours.   Walking will help get rid of the air and reduce the bloated feeling in your belly (abdomen).   No driving for 24 hours (because of the medicine (anesthesia) used during the test).   You may shower.   Do not sign any important legal documents or operate any machinery for 24 hours (because of the anesthesia used during the test).    NUTRITION  Drink plenty of fluids.   You may resume your normal diet as instructed by your doctor.   Begin with a light meal and progress to your normal diet. Heavy or fried foods are harder to digest and may make you feel sick to your stomach (nauseated).   Avoid alcoholic beverages for 24 hours or as instructed.    MEDICATIONS  You may resume your normal medications.   WHAT YOU CAN EXPECT TODAY  Some feelings of bloating in the abdomen.   Passage of more gas than usual.    IF YOU HAD A BIOPSY TAKEN DURING THE UPPER ENDOSCOPY:  Eat a soft diet IF YOU HAVE NAUSEA,  BLOATING, ABDOMINAL PAIN, OR VOMITING.    FINDING OUT THE RESULTS OF YOUR TEST Not all test results are available during your visit. DR. Darrick Penna WILL CALL YOU WITHIN 7 DAYS OF YOUR PROCEDUE WITH YOUR RESULTS. Do not assume everything is normal if you have not heard from DR. Ayumi Wangerin IN ONE WEEK, CALL HER OFFICE AT [phone].  SEEK IMMEDIATE MEDICAL ATTENTION AND CALL THE OFFICE: [phone] IF:  You have more than a spotting of blood in your stool.   Your belly is swollen (abdominal distention).   You are nauseated or vomiting.   You have a temperature over 101F.   You have abdominal pain or discomfort that is severe or gets worse throughout the day.    Low-Fat Diet  BREADS, CEREALS, PASTA, RICE, DRIED PEAS, AND BEANS These products are high in carbohydrates and most are low in fat. Therefore, they can be increased in the diet as substitutes for fatty foods. They too, however, contain calories and should not be eaten in excess. Cereals can be eaten for snacks as well as for breakfast.  Include foods that contain fiber (fruits, vegetables, whole grains, and legumes). Research shows that fiber may lower blood cholesterol levels, especially the water-soluble fiber found in fruits, vegetables, oat products, and legumes.  FRUITS AND VEGETABLES It is good to eat fruits and vegetables. Besides being sources of fiber, both are rich in vitamins and some minerals. They help you get the daily allowances of these  nutrients. Fruits and vegetables can be used for snacks and desserts.  MEATS Limit lean meat, chicken, [place], and fish to no more than 6 ounces per day.  Beef, Pork, and Lamb Use lean cuts of beef, pork, and lamb. Lean cuts include:  Extra-lean ground beef.  Arm roast.  Sirloin tip.  Center-cut ham.  Round steak.  Loin chops.  Rump roast.  Tenderloin.  Trim all fat off the outside of meats before cooking. It is not necessary to severely decrease the intake of red meat, but  lean choices should be made. Lean meat is rich in protein and contains a highly absorbable form of iron. Premenopausal women, in particular, should avoid reducing lean red meat because this could increase the risk for low red blood cells (iron-deficiency anemia).  Chicken and [place] These are good sources of protein. The fat of poultry can be reduced by removing the skin and underlying fat layers before cooking. Chicken and [place] can be substituted for lean red meat in the diet. Poultry should not be fried or covered with high-fat sauces.  Fish and Shellfish Fish is a good source of protein. Shellfish contain cholesterol, but they usually are low in saturated fatty acids. The preparation of fish is important. Like chicken and [place], they should not be fried or covered with high-fat sauces.  EGGS Egg whites contain no fat or cholesterol. They can be eaten often. Try 1 to 2 egg whites instead of whole eggs in recipes or use egg substitutes that do not contain yolk.  MILK AND DAIRY PRODUCTS Use skim or 1% milk instead of 2% or whole milk. Decrease whole milk, natural, and processed cheeses. Use nonfat or low-fat (2%) cottage cheese or low-fat cheeses made from vegetable oils. Choose nonfat or low-fat (1 to 2%) yogurt. Experiment with evaporated skim milk in recipes that call for heavy cream. Substitute low-fat yogurt or low-fat cottage cheese for sour cream in dips and salad dressings. Have at least 2 servings of low-fat dairy products, such as 2 glasses of skim (or 1%) milk each day to help get your daily calcium intake.  FATS AND OILS Reduce the total intake of fats, especially saturated fat. Butterfat, lard, and beef fats are high in saturated fat and cholesterol. These should be avoided as much as possible. Vegetable fats do not contain cholesterol, but certain vegetable fats, such as coconut oil, palm oil, and palm kernel oil are very high in saturated fats. These should be limited. These fats are  often used in bakery goods, processed foods, popcorn, oils, and nondairy creamers. Vegetable shortenings and some peanut butters contain hydrogenated oils, which are also saturated fats. Read the labels on these foods and check for saturated vegetable oils.  Unsaturated vegetable oils and fats do not raise blood cholesterol. However, they should be limited because they are fats and are high in calories. Total fat should still be limited to 30% of your daily caloric intake. Desirable liquid vegetable oils are corn oil, cottonseed oil, olive oil, canola oil, safflower oil, soybean oil, and sunflower oil. Peanut oil is not as good, but small amounts are acceptable. Buy a heart-healthy tub margarine that has no partially hydrogenated oils in the ingredients. Mayonnaise and salad dressings often are made from unsaturated fats, but they should also be limited because of their high calorie and fat content. Seeds, nuts, peanut butter, olives, and avocados are high in fat, but the fat is mainly the unsaturated type. These foods should be limited mainly to  avoid excess calories and fat.  OTHER EATING TIPS Snacks  Most sweets should be limited as snacks. They tend to be rich in calories and fats, and their caloric content outweighs their nutritional value. Some good choices in snacks are graham crackers, melba toast, soda crackers, bagels (no egg), English muffins, fruits, and vegetables. These snacks are preferable to snack crackers, [place] fries, and chips. Popcorn should be air-popped or cooked in small amounts of liquid vegetable oil.  Desserts Eat fruit, low-fat yogurt, and fruit ices instead of pastries, cake, and cookies. Sherbet, angel food cake, gelatin dessert, frozen low-fat yogurt, or other frozen products that do not contain saturated fat (pure fruit juice bars, frozen ice pops) are also acceptable.   COOKING METHODS Choose those methods that use little or no fat. They include: Poaching.  Braising.    Steaming.  Grilling.  Baking.  Stir-frying.  Broiling.  Microwaving.  Foods can be cooked in a nonstick pan without added fat, or use a nonfat cooking spray in regular cookware. Limit fried foods and avoid frying in saturated fat. Add moisture to lean meats by using water, broth, cooking wines, and other nonfat or low-fat sauces along with the cooking methods mentioned above. Soups and stews should be chilled after cooking. The fat that forms on top after a few hours in the refrigerator should be skimmed off. When preparing meals, avoid using excess salt. Salt can contribute to raising blood pressure in some people.  EATING AWAY FROM HOME Order entres, potatoes, and vegetables without sauces or butter. When meat exceeds the size of a deck of cards (3 to 4 ounces), the rest can be taken home for another meal. Choose vegetable or fruit salads and ask for low-calorie salad dressings to be served on the side. Use dressings sparingly. Limit high-fat toppings, such as bacon, crumbled eggs, cheese, sunflower seeds, and olives. Ask for heart-healthy tub margarine instead of butter.

## 2015-01-27 NOTE — Care Management Note (Signed)
Case Management Note  Patient Details  Name: ARELIZ ROTHMAN MRN: [id number] Date of Birth: 05-Oct-1945  Subjective/Objective:                    Action/Plan:   Expected Discharge Date:                  Expected Discharge Plan:  Home/Self Care  In-House Referral:  NA  Discharge planning Services  CM Consult  Post Acute Care Choice:  NA Choice offered to:  NA  DME Arranged:    DME Agency:     HH Arranged:    HH Agency:     Status of Service:  Completed, signed off  Medicare Important Message Given:    Date Medicare IM Given:    Medicare IM give by:    Date Additional Medicare IM Given:    Additional Medicare Important Message give by:     If discussed at Long Length of Stay Meetings, dates discussed:    Additional Comments: Pt having ERCP today. Anticipate discharge within 24 hours if pt stable. No CM needs noted at this time. Arlyss Queen Markham, RN 01/27/2015, 7:53 AM

## 2015-01-27 NOTE — Discharge Summary (Signed)
Physician Discharge Summary  Patient ID: Melissa Bullock MRN: [id number] DOB/AGE: December 18, 1945 69 y.o.  Admit date: 01/25/2015 Discharge date: 01/27/2015  Admission Diagnoses: RUQ ABDOMINAL PAIN  Discharge Diagnoses:  Principal Problem:   Acute cholecystitis Active Problems:   Hyponatremia   Hyperglycemia   HLD (hyperlipidemia)   GERD without esophagitis   Common bile duct dilatation   Common bile duct stone   Cholangitis   Discharged Condition: stable  Hospital Course: PT ADMITTED JUL 27 WITH RUQ ABDOMINAL PAIN & NORMAL LIVER PANEL. CT REVEALED CHOLECYSTITIS AND DILATED CBD/CHD AND IH DUCTS. PT SPIKED TEMP TO 102.53F. MRCP REVEALED IMPACTED CBD STONE. ERCP/SPHINCTEROTOMY/STONE EXTRACTION PERFORMED JUL 29. SHE HAS BEEN AFEBRILE FOR > 24 HRS. PT DISCHARGED TO HOME W/O NAUSEA, VOMITING, OR ABDOMINAL PAIN. TOLERATING POs.  Consults: GI  Significant Diagnostic Studies: radiology: MRI: IMPACTED CBD STONE, DILATED CBD  Treatments: antibiotics: Cipro, Unasyn and metronidazole  Discharge Exam: Blood pressure 118/48, pulse 79, temperature 98.5 F (36.9 C), temperature source Oral, resp. rate 20, height  (1.626 m), weight 178 lb (80.74 kg), SpO2 97 %. General appearance: alert, cooperative and no distress Resp: clear to auscultation bilaterally Cardio: regular rate and rhythm GI: soft, non-tender; bowel sounds normal; no masses,  no organomegaly Neurologic: Grossly normal  Disposition: 01-Home or Self Care     Medication List    STOP taking these medications        aspirin EC 81 MG tablet      TAKE these medications        alum & mag hydroxide-simeth 400-400-40 MG/5ML suspension  Commonly known as:  MAALOX PLUS  Take 15 mLs by mouth every 6 (six) hours as needed for indigestion.     co-enzyme Q-10 30 MG capsule  Take 30 mg by mouth 3 (three) times daily.     dicyclomine 10 MG capsule  Commonly known as:  BENTYL  Take 10 mg by mouth 4 (four) times daily -  before  meals and at bedtime.     multivitamin with minerals Tabs tablet  Take 1 tablet by mouth daily.     multivitamin-lutein Caps capsule  Take 1 capsule by mouth daily.     pantoprazole 40 MG tablet  Commonly known as:  PROTONIX  Take 40 mg by mouth daily.     simvastatin 20 MG tablet  Commonly known as:  ZOCOR  Take 20 mg by mouth daily.     sucralfate 1 GM/10ML suspension  Commonly known as:  CARAFATE  Take 1 g by mouth 4 (four) times daily -  with meals and at bedtime. Starting 01/20/2015 x 10 days.           Follow-up Information    Follow up with Dalia Heading, MD. Schedule an appointment as soon as possible for a visit in 1 week.   Specialty:  General Surgery   Contact information:   [address] [place] [address] [phone]       Signed: Jonette Eva 01/27/2015, 4:27 PM

## 2015-01-27 NOTE — H&P (Signed)
  Primary Care Physician:  Alice Reichert, MD Primary Gastroenterologist:  Dr. Darrick Penna  Pre-Procedure History & Physical: HPI:  Melissa Bullock is a 69 y.o. female here for [facility] COMMON BILE DUCT(CBD) STONE  Past Medical History  Diagnosis Date  . Hypercholesteremia   . E coli enteritis   . Hiatal hernia     History reviewed. No pertinent past surgical history.  Prior to Admission medications   Medication Sig Start Date End Date Taking? Authorizing Provider  alum & mag hydroxide-simeth (MAALOX PLUS) 400-400-40 MG/5ML suspension Take 15 mLs by mouth every 6 (six) hours as needed for indigestion.   Yes Historical Provider, MD  aspirin EC 81 MG tablet Take 81 mg by mouth daily.   Yes Historical Provider, MD  co-enzyme Q-10 30 MG capsule Take 30 mg by mouth 3 (three) times daily.   Yes Historical Provider, MD  dicyclomine (BENTYL) 10 MG capsule Take 10 mg by mouth 4 (four) times daily -  before meals and at bedtime.   Yes Historical Provider, MD  Multiple Vitamin (MULTIVITAMIN WITH MINERALS) TABS tablet Take 1 tablet by mouth daily.   Yes Historical Provider, MD  multivitamin-lutein (OCUVITE-LUTEIN) CAPS capsule Take 1 capsule by mouth daily.   Yes Historical Provider, MD  pantoprazole (PROTONIX) 40 MG tablet Take 40 mg by mouth daily.   Yes Historical Provider, MD  simvastatin (ZOCOR) 20 MG tablet Take 20 mg by mouth daily.   Yes Historical Provider, MD  sucralfate (CARAFATE) 1 GM/10ML suspension Take 1 g by mouth 4 (four) times daily -  with meals and at bedtime. Starting 01/20/2015 x 10 days.   Yes Historical Provider, MD    Allergies as of 01/25/2015 - Review Complete 01/25/2015  Allergen Reaction Noted  . Sulfonamide derivatives Nausea And Vomiting     Family History  Problem Relation Age of Onset  . Heart disease Mother     History   Social History  . Marital Status: Divorced    Spouse Name: N/A  . Number of Children: N/A  . Years of Education: N/A    Occupational History  . Not on file.   Social History Main Topics  . Smoking status: Never Smoker   . Smokeless tobacco: Not on file  . Alcohol Use: No  . Drug Use: Not on file  . Sexual Activity: Not on file   Other Topics Concern  . Not on file   Social History Narrative    Review of Systems: See HPI, otherwise negative ROS   Physical Exam: BP 108/59 mmHg  Pulse 92  Temp(Src) 98.9 F (37.2 C) (Oral)  Resp 22  Ht  (1.626 m)  Wt 178 lb (80.74 kg)  BMI 30.54 kg/m2  SpO2 91% General:   Alert,  pleasant and cooperative in NAD Head:  Normocephalic and atraumatic. Neck:  Supple; Lungs:  Clear throughout to auscultation.    Heart:  Regular rate and rhythm. Abdomen:  Soft, nontender and nondistended. Normal bowel sounds, without guarding, and without rebound.   Neurologic:  Alert and  oriented x4;  grossly normal neurologically.  Impression/Plan:    CHOLANGITIS-IMPACTED CBD STONE  PLAN:  1. ERCP/SPHINCTEROTOMY/STONE EXTRACTION TODAY.

## 2015-01-27 NOTE — Anesthesia Preprocedure Evaluation (Addendum)
Anesthesia Evaluation  Patient identified by MRN, date of birth, ID band Patient awake    Reviewed: Allergy & Precautions, NPO status , Patient's Chart, lab work & pertinent test results  Airway Mallampati: II  TM Distance: >3 FB     Dental  (+) Teeth Intact, Dental Advisory Given   Pulmonary neg pulmonary ROS,  breath sounds clear to auscultation        Cardiovascular negative cardio ROS  Rhythm:Regular Rate:Normal     Neuro/Psych    GI/Hepatic hiatal hernia, GERD-  Controlled and Medicated,  Endo/Other    Renal/GU      Musculoskeletal   Abdominal   Peds  Hematology   Anesthesia Other Findings   Reproductive/Obstetrics                             Anesthesia Physical Anesthesia Plan  ASA: II  Anesthesia Plan: General   Post-op Pain Management:    Induction: Intravenous, Rapid sequence and Cricoid pressure planned  Airway Management Planned: Oral ETT  Additional Equipment:   Intra-op Plan:   Post-operative Plan: Extubation in OR  Informed Consent: I have reviewed the patients History and Physical, chart, labs and discussed the procedure including the risks, benefits and alternatives for the proposed anesthesia with the patient or authorized representative who has indicated his/her understanding and acceptance.     Plan Discussed with:   Anesthesia Plan Comments:         Anesthesia Quick Evaluation  

## 2015-01-27 NOTE — Op Note (Signed)
[facility] [address] [place], [address]   ERCP PROCEDURE REPORT  PATIENT: Melissa Bullock, Melissa Bullock  MR#: #[id number] BIRTHDATE: 12-14-1945  GENDER: female  ENDOSCOPIST: West Bali, MD REFERRED BY: Franky Macho, M.D.  Butch Penny, M.D.  PROCEDURE DATE:  01/27/2015 PROCEDURE:   ERCP with sphincterotomy/papillotomy and ERCP with removal of calculus/calculi INDICATIONS:established IMPACTED bile duct stone).  MEDICATIONS: Per Anesthesia   TOPICAL ANESTHETIC:   none  DESCRIPTION OF PROCEDURE:     Physical exam was performed.  Informed consent was obtained from the patient after explaining the benefits, risks, and alternatives to the procedure.  The patient was connected to the monitor and placed in the semi-prone position. IV medicine was administered through an indwelling cannula and oxygen via endotracheal tube.  After administration of sedation, the patients esophagus was intubated and the     endoscope was advanced under direct visualization to the second portion of the duodenum.  The pancreatic duct was cannulated & WIRE PASSED INTO PROXIMAL DUCT UNintentionally.1 CC DYE INJECTED. IT WAS NORMAL CALIBER. IT WAS DIFFICULT TO PASS THE SPHINCTERTOME INTO THE DISTAL CBD DUE TO IMPACTED STONE/POSSIBLE STRICTURE. SPHINCTERTOME WITHDRAWN AND REPOSITIONED. The bile duct was successfully cannulated using the [facility] [employer] sphincterotome with a 0.25 inch wire.  AN occlusive cholangiogram was performed.  The right and left intrahepatic ducts were DILATED  The cystic duct was NOT visualized and  The common HEPATIC/BILE duct WERE approximately 15mm.  ONE filling defect WAS appreciated.  The sphincterotome was withdrawn and a sphincterotomy was performed using PURE CUT(200) THEN blended current .  No bleeding was noted. A BALLON CATHETER WAS PASSED THRICE INTO THE CBD. STONE AND STONE FRGAMNETS WERE EXTRACTED. POST-EXTRACTION CHOLANGIOGRAM REVEALED NO FILLING  DEFECT IN CBD/CHD. THE SCOPE WAS WITHDRAWN.  The patient was recovered in the ICU and discharged to their floor bed in satisfactory condition.  Estimated blood loss is zero unless otherwise noted in this procedure report. LIMITED VIEW OF ESOPHAGUS.  NORMAL GASTRIC MUCOSA.    The scope was then completely withdrawn from the patient and the procedure terminated.    COMPLICATIONS:    None  ENDOSCOPIC IMPRESSION: 1.   IMPACTED CBD STONE S/P SPHINCTEROTOMY/STONE EXTRACTION 2.   LIMITED VIEW OF ESOPHAGUS.  NORMAL GASTRIC MUCOSA  RECOMMENDATIONS: NO ASA/NSAIDS/ANTICOAGULATION FOR 7 DAYS ADVANCE DIET & DISCHARGE TO HOME IF NO ABDOMINAL PAIN, NAUSEA, OR VOMITING MAY D/C UNASYN ON DISCHARGE. ELECTIVE CHOLECYSTECTOMY IN THE NEAR FUTURE    _______________________________ eSignedWest Bali, MD 01/27/2015 12:50 PM

## 2015-01-27 NOTE — Anesthesia Procedure Notes (Signed)
Procedure Name: Intubation Date/Time: 01/27/2015 10:27 AM Performed by: Despina Hidden Pre-anesthesia Checklist: Emergency Drugs available, Patient identified, Suction available and Patient being monitored Patient Re-evaluated:Patient Re-evaluated prior to inductionOxygen Delivery Method: Circle system utilized Preoxygenation: Pre-oxygenation with 100% oxygen Intubation Type: IV induction, Rapid sequence and Cricoid Pressure applied Ventilation: Mask ventilation without difficulty Laryngoscope Size: Mac and 3 Grade View: Grade II Tube type: Oral Number of attempts: 1 Airway Equipment and Method: Stylet Secured at: 22 cm Tube secured with: Tape Dental Injury: Teeth and Oropharynx as per pre-operative assessment

## 2015-01-27 NOTE — Anesthesia Postprocedure Evaluation (Signed)
  Anesthesia Post-op Note  Patient: Melissa Bullock  Procedure(s) Performed: Procedure(s): ENDOSCOPIC RETROGRADE CHOLANGIOPANCREATOGRAPHY (ERCP) (N/A) SPHINCTEROTOMY (N/A) REMOVAL OF STONES (N/A)  Patient Location: PACU  Anesthesia Type:General  Level of Consciousness: awake, alert , oriented and patient cooperative  Airway and Oxygen Therapy: Patient Spontanous Breathing  Post-op Pain: none  Post-op Assessment: Post-op Vital signs reviewed, Patient's Cardiovascular Status Stable, Respiratory Function Stable, Patent Airway, No signs of Nausea or vomiting and Pain level controlled              Post-op Vital Signs: Reviewed and stable  Last Vitals:  Filed Vitals:   01/27/15 1300  BP: 99/57  Pulse: 81  Temp:   Resp: 18    Complications: No apparent anesthesia complications

## 2015-01-27 NOTE — Transfer of Care (Signed)
Immediate Anesthesia Transfer of Care Note  Patient: Melissa Bullock  Procedure(s) Performed: Procedure(s): ENDOSCOPIC RETROGRADE CHOLANGIOPANCREATOGRAPHY (ERCP) (N/A) SPHINCTEROTOMY (N/A) REMOVAL OF STONES (N/A)  Patient Location: PACU  Anesthesia Type:General  Level of Consciousness: awake and patient cooperative  Airway & Oxygen Therapy: Patient Spontanous Breathing and Patient connected to face mask oxygen  Post-op Assessment: Report given to RN, Post -op Vital signs reviewed and stable and Patient moving all extremities  Post vital signs: Reviewed and stable  Last Vitals:  Filed Vitals:   01/27/15 1015  BP: 108/53  Pulse:   Temp:   Resp: 23    Complications: No apparent anesthesia complications

## 2015-01-28 NOTE — Progress Notes (Signed)
Late Entry for 01/27/2015  Patient discharged with instructions, prescription, and care notes.  Verbalized understanding via teach back.  IV was removed and the site was WNL. Patient voiced no further complaints or concerns at the time of discharge.  Appointments scheduled per instructions.  Patient left the floor via w/c with staff and family in stable condition.

## 2015-01-30 ENCOUNTER — Encounter (HOSPITAL_COMMUNITY): Payer: Self-pay | Admitting: Gastroenterology

## 2015-01-30 ENCOUNTER — Telehealth: Payer: Self-pay

## 2015-01-30 MED ORDER — PROMETHAZINE HCL 12.5 MG PO TABS: ORAL_TABLET | ORAL | Status: DC

## 2015-01-30 MED ORDER — METRONIDAZOLE 500 MG PO TABS: 500.0000 mg | ORAL_TABLET | Freq: Two times a day (BID) | ORAL | Status: DC

## 2015-01-30 MED ORDER — HYDROCODONE-ACETAMINOPHEN 5-325 MG PO TABS: ORAL_TABLET | ORAL | Status: DC

## 2015-01-30 MED ORDER — CIPROFLOXACIN HCL 500 MG PO TABS: ORAL_TABLET | ORAL | Status: DC

## 2015-01-30 NOTE — Telephone Encounter (Signed)
NOTED

## 2015-01-30 NOTE — Telephone Encounter (Signed)
Called patient TO DISCUSS CONCERNS. BEEN IN PAIN. IN PAIN AND SICK TO HIS STOMACH. TAKING TYLENOL FOR PAIN. DOESN'T NEED ANYTHING FOR NAUSEA OR VOMITING. HAVING LOOSE STOOLS AND TAKING FOR PAIN. TAKING PROTONIX QD. TAKING CARAFATE QID.   STOP CARAFTE AND MAALOX/MYLANTA. TAKE CIPRO AND FLAGYL TWICE DAILY FOR 7 DAYS.  MEDICATION SIDE EFFECTS INCLUDE HEEL PAIN, NAUSEA, VOMITING. PATIENT SHOULD AVOID ALCOHOL AND COUGH SYRUP WITH ALCOHOL. PHENERGAN AS NEEDED FOR NAUSEA OR VOMITING. VICODIN 1 OR 2 PO EVERY 6 HRS AS NEEDED FOR PAIN, DO NOT TAKE WITH TYLENOL.WILL CALL DR. Lovell Sheehan OFC TO SCHEDULE CHOLECYSTECTOMY AUG 8 OR 9.  PT WILL CALL WED AFTERNOON OF [facility]. WILL PICK UP RX FROM [facility] SHORT STAY.

## 2015-01-30 NOTE — Telephone Encounter (Signed)
Dr. Renard Matter called asking if we could reach out to patient to see what is going on.   Doris, please call and see what her symptoms are.

## 2015-01-30 NOTE — Telephone Encounter (Signed)
Pt called and states she wanted to talk to Dr. Darrick Penna about having her gallbladder removed. States she called Dr. Lovell Sheehan office and got a recording that he is on vacation until 02/06/2015.  Pt states she can't suffer with her pain that long. Please advise

## 2015-01-31 ENCOUNTER — Telehealth: Payer: Self-pay

## 2015-01-31 LAB — CULTURE, BLOOD (ROUTINE X 2)
CULTURE: NO GROWTH
CULTURE: NO GROWTH

## 2015-01-31 NOTE — Telephone Encounter (Signed)
Called patient TO DISCUSS CONCERNS. FEELING A BIT BETTER. PT NEED PRE-OP APPT THIS WEEK FOR CHOLECYSTECTOMY WITH DR. Lovell Sheehan. PT INSTRUCTED TO BE NPO AFTER [place] SUN IN THE EVENT THAT DR. Lovell Sheehan CAN DO SURGERY MON AUG 8. SHE WILL CALL AT 0830 TO SEE IF HE CAN ADD HER ON.

## 2015-01-31 NOTE — Telephone Encounter (Signed)
Pt has an office visit with Dr.Jenkins on 02/09/15 @ 10:45 but she is NOT happy having to wait that long just because a doctor is on vacation. She said that something has to be done now. Please advise

## 2015-02-01 NOTE — Telephone Encounter (Signed)
Pt is aware.  

## 2015-02-01 NOTE — Telephone Encounter (Signed)
PLEASE CALL PT. I SPOKE TO CAROLYN. She will not need a pre-op appt because she already has labs in thE system. We will cal her around 830 am AUG 8 WITH INSTRUCTIONS REGARDING HER SURGERY. SHE SHOULD NOT DRINK OR EAT ANYTHING AFTER MIDNIGHT ON SUN. SHE SHOULD HOLD ASPRIRIN.

## 2015-02-01 NOTE — Telephone Encounter (Signed)
Called Melissa Bullock at short stay to set up a pre-op appt for pt. She states that they can't do a pre-op without orders in computer from Dr. Lovell Sheehan. However she said that if Dr. Lovell Sheehan adds patient on his schedule for 02/06/2015 they can do a same day work up for pt.

## 2015-02-01 NOTE — Telephone Encounter (Signed)
We may need to call Dr. Lovell Sheehan cell and let Dr. Darrick Penna speak with him about this matter.

## 2015-02-08 ENCOUNTER — Encounter (HOSPITAL_COMMUNITY)
Admission: RE | Admit: 2015-02-08 | Discharge: 2015-02-08 | Disposition: A | Discharge status: Home / Self Care | Payer: Medicare HMO | Source: Ambulatory Visit | Attending: General Surgery | Admitting: General Surgery

## 2015-02-08 ENCOUNTER — Encounter (HOSPITAL_COMMUNITY): Payer: Self-pay

## 2015-02-08 NOTE — H&P (Signed)
Melissa Bullock is an 69 y.o. female.   Chief Complaint: Cholecystitis, cholelithiasis, status post ERCP for choledocholithiasis HPI: Patient is a 69 year old white female who was admitted to the hospital several weeks ago with worsening right upper quadrant abdominal pain. She was noted on ultrasound to have cholelithiasis. MRCP showed choledocholithiasis. She underwent ERCP with stone extraction on 01/27/2015. She now presents for laparoscopic cholecystectomy.  Past Medical History  Diagnosis Date  . Hypercholesteremia   . E coli enteritis   . Hiatal hernia     Past Surgical History  Procedure Laterality Date  . Ercp N/A 01/27/2015    Procedure: ENDOSCOPIC RETROGRADE CHOLANGIOPANCREATOGRAPHY (ERCP);  Surgeon: West Bali, MD;  Location: [facility];  Service: Endoscopy;  Laterality: N/A;  . Sphincterotomy N/A 01/27/2015    Procedure: SPHINCTEROTOMY;  Surgeon: West Bali, MD;  Location: [facility];  Service: Endoscopy;  Laterality: N/A;  . Removal of stones N/A 01/27/2015    Procedure: REMOVAL OF STONES;  Surgeon: West Bali, MD;  Location: [facility];  Service: Endoscopy;  Laterality: N/A;    Family History  Problem Relation Age of Onset  . Heart disease Mother    Social History:  reports that she has never smoked. She does not have any smokeless tobacco history on file. She reports that she does not drink alcohol. Her drug history is not on file.  Allergies:  Allergies  Allergen Reactions  . Sulfonamide Derivatives Nausea And Vomiting    No prescriptions prior to admission    No results found for this or any previous visit (from the past 48 hour(s)). No results found.  Review of Systems  Constitutional: Negative.   HENT: Negative.   Eyes: Negative.   Respiratory: Negative.   Cardiovascular: Negative.   Gastrointestinal: Positive for heartburn and abdominal pain.  Musculoskeletal: Negative.   Skin: Negative.     There were no vitals taken for this visit. Physical  Exam  Constitutional: She is oriented to person, place, and time. She appears well-developed and well-nourished.  HENT:  Head: Normocephalic and atraumatic.  Eyes: No scleral icterus.  Neck: Normal range of motion. Neck supple.  Cardiovascular: Normal rate, regular rhythm and normal heart sounds.   Respiratory: Effort normal and breath sounds normal.  GI: Soft. She exhibits no distension. There is no tenderness.  Neurological: She is alert and oriented to person, place, and time.  Skin: Skin is warm and dry.     Assessment/Plan Impression: Cholecystitis, cholelithiasis Plan: Patient undergo a laparoscopic cholecystectomy on 02/10/2015. The risks and benefits of the procedure including bleeding, infection, hepatobiliary injury, and the possibility of an open procedure were fully explained to the patient, who gave informed consent.  Blane Worthington A 02/08/2015, 9:44 AM

## 2015-02-09 NOTE — OR Nursing (Signed)
WBC's  15.3  ,  Reported to Dr. Lovell Sheehan ,  No further orders

## 2015-02-10 ENCOUNTER — Ambulatory Visit (HOSPITAL_COMMUNITY): Payer: Medicare HMO | Admitting: Anesthesiology

## 2015-02-10 ENCOUNTER — Encounter (HOSPITAL_COMMUNITY)
Admission: RE | Disposition: A | Discharge status: Home / Self Care | Payer: Self-pay | Source: Ambulatory Visit | Attending: General Surgery

## 2015-02-10 ENCOUNTER — Encounter (HOSPITAL_COMMUNITY): Payer: Self-pay | Admitting: *Deleted

## 2015-02-10 ENCOUNTER — Ambulatory Visit (HOSPITAL_COMMUNITY)
Admission: RE | Admit: 2015-02-10 | Discharge: 2015-02-10 | Disposition: A | Discharge status: Home / Self Care | Payer: Medicare HMO | Source: Ambulatory Visit | Attending: General Surgery | Admitting: General Surgery

## 2015-02-10 DIAGNOSIS — K219 Gastro-esophageal reflux disease without esophagitis: Secondary | ICD-10-CM | POA: Insufficient documentation

## 2015-02-10 DIAGNOSIS — K8010 Calculus of gallbladder with chronic cholecystitis without obstruction: Secondary | ICD-10-CM | POA: Insufficient documentation

## 2015-02-10 DIAGNOSIS — E780 Pure hypercholesterolemia: Secondary | ICD-10-CM | POA: Diagnosis not present

## 2015-02-10 DIAGNOSIS — K449 Diaphragmatic hernia without obstruction or gangrene: Secondary | ICD-10-CM | POA: Insufficient documentation

## 2015-02-10 HISTORY — PX: CHOLECYSTECTOMY: SHX55

## 2015-02-10 SURGERY — LAPAROSCOPIC CHOLECYSTECTOMY
Anesthesia: General

## 2015-02-10 MED ORDER — BUPIVACAINE HCL (PF) 0.5 % IJ SOLN
INTRAMUSCULAR | Status: AC
  Filled 2015-02-10: qty 30

## 2015-02-10 MED ORDER — SODIUM CHLORIDE 0.9 % IR SOLN
Status: DC | PRN
  Administered 2015-02-10 (×2): 1000 mL

## 2015-02-10 MED ORDER — LABETALOL HCL 5 MG/ML IV SOLN
INTRAVENOUS | Status: AC
  Filled 2015-02-10: qty 4

## 2015-02-10 MED ORDER — LIDOCAINE HCL 1 % IJ SOLN
INTRAMUSCULAR | Status: DC | PRN
  Administered 2015-02-10: 30 mg via INTRADERMAL

## 2015-02-10 MED ORDER — HYDROCODONE-ACETAMINOPHEN 5-325 MG PO TABS: ORAL_TABLET | ORAL | Status: DC

## 2015-02-10 MED ORDER — DEXAMETHASONE SODIUM PHOSPHATE 4 MG/ML IJ SOLN
4.0000 mg | Freq: Once | INTRAMUSCULAR | Status: AC
  Administered 2015-02-10: 4 mg via INTRAVENOUS

## 2015-02-10 MED ORDER — MIDAZOLAM HCL 2 MG/2ML IJ SOLN
INTRAMUSCULAR | Status: AC
  Filled 2015-02-10: qty 4

## 2015-02-10 MED ORDER — SUCCINYLCHOLINE CHLORIDE 20 MG/ML IJ SOLN
INTRAMUSCULAR | Status: DC | PRN
  Administered 2015-02-10: 140 mg via INTRAVENOUS

## 2015-02-10 MED ORDER — MIDAZOLAM HCL 5 MG/5ML IJ SOLN
INTRAMUSCULAR | Status: DC | PRN
  Administered 2015-02-10: 2 mg via INTRAVENOUS

## 2015-02-10 MED ORDER — CIPROFLOXACIN IN D5W 400 MG/200ML IV SOLN
400.0000 mg | INTRAVENOUS | Status: AC
  Administered 2015-02-10: 400 mg via INTRAVENOUS

## 2015-02-10 MED ORDER — ROCURONIUM BROMIDE 50 MG/5ML IV SOLN
INTRAVENOUS | Status: AC
  Filled 2015-02-10: qty 1

## 2015-02-10 MED ORDER — LACTATED RINGERS IV SOLN
INTRAVENOUS | Status: DC
  Administered 2015-02-10: 11:00:00 via INTRAVENOUS

## 2015-02-10 MED ORDER — KETOROLAC TROMETHAMINE 30 MG/ML IJ SOLN
30.0000 mg | Freq: Once | INTRAMUSCULAR | Status: AC
  Administered 2015-02-10: 30 mg via INTRAVENOUS

## 2015-02-10 MED ORDER — ONDANSETRON HCL 4 MG/2ML IJ SOLN
4.0000 mg | Freq: Once | INTRAMUSCULAR | Status: AC
  Administered 2015-02-10: 4 mg via INTRAVENOUS

## 2015-02-10 MED ORDER — FENTANYL CITRATE (PF) 100 MCG/2ML IJ SOLN
INTRAMUSCULAR | Status: DC | PRN
  Administered 2015-02-10: 100 ug via INTRAVENOUS
  Administered 2015-02-10 (×3): 50 ug via INTRAVENOUS

## 2015-02-10 MED ORDER — FENTANYL CITRATE (PF) 100 MCG/2ML IJ SOLN
25.0000 ug | INTRAMUSCULAR | Status: DC | PRN
  Administered 2015-02-10 (×2): 50 ug via INTRAVENOUS
  Filled 2015-02-10: qty 2

## 2015-02-10 MED ORDER — CIPROFLOXACIN IN D5W 400 MG/200ML IV SOLN
INTRAVENOUS | Status: AC
  Filled 2015-02-10: qty 200

## 2015-02-10 MED ORDER — BUPIVACAINE HCL (PF) 0.5 % IJ SOLN
INTRAMUSCULAR | Status: DC | PRN
  Administered 2015-02-10: 10 mL

## 2015-02-10 MED ORDER — PROPOFOL 10 MG/ML IV BOLUS
INTRAVENOUS | Status: DC | PRN
  Administered 2015-02-10: 140 mg via INTRAVENOUS

## 2015-02-10 MED ORDER — ROCURONIUM BROMIDE 100 MG/10ML IV SOLN
INTRAVENOUS | Status: DC | PRN
  Administered 2015-02-10: 15 mg via INTRAVENOUS
  Administered 2015-02-10: 5 mg via INTRAVENOUS

## 2015-02-10 MED ORDER — LIDOCAINE HCL (PF) 1 % IJ SOLN
INTRAMUSCULAR | Status: AC
  Filled 2015-02-10: qty 5

## 2015-02-10 MED ORDER — CHLORHEXIDINE GLUCONATE 4 % EX LIQD: 1.0000 "application " | Freq: Once | CUTANEOUS | Status: DC

## 2015-02-10 MED ORDER — POVIDONE-IODINE 10 % EX OINT
TOPICAL_OINTMENT | CUTANEOUS | Status: AC
  Filled 2015-02-10: qty 1

## 2015-02-10 MED ORDER — HEMOSTATIC AGENTS (NO CHARGE) OPTIME
TOPICAL | Status: DC | PRN
Start: 2015-02-10 — End: 2015-02-10
  Administered 2015-02-10: 1 via TOPICAL
  Administered 2015-02-10: 2 via TOPICAL

## 2015-02-10 MED ORDER — POVIDONE-IODINE 10 % OINT PACKET
TOPICAL_OINTMENT | CUTANEOUS | Status: DC | PRN
  Administered 2015-02-10: 1 via TOPICAL

## 2015-02-10 MED ORDER — MIDAZOLAM HCL 2 MG/2ML IJ SOLN
1.0000 mg | INTRAMUSCULAR | Status: DC | PRN
  Administered 2015-02-10: 2 mg via INTRAVENOUS

## 2015-02-10 MED ORDER — NEOSTIGMINE METHYLSULFATE 10 MG/10ML IV SOLN
INTRAVENOUS | Status: DC | PRN
  Administered 2015-02-10: 2 mg via INTRAVENOUS
  Administered 2015-02-10: 1 mg via INTRAVENOUS

## 2015-02-10 MED ORDER — LABETALOL HCL 5 MG/ML IV SOLN
INTRAVENOUS | Status: DC | PRN
  Administered 2015-02-10 (×2): 10 mg via INTRAVENOUS

## 2015-02-10 MED ORDER — ONDANSETRON HCL 4 MG/2ML IJ SOLN
INTRAMUSCULAR | Status: AC
  Filled 2015-02-10: qty 2

## 2015-02-10 MED ORDER — MIDAZOLAM HCL 2 MG/2ML IJ SOLN
INTRAMUSCULAR | Status: AC
  Filled 2015-02-10: qty 2

## 2015-02-10 MED ORDER — PROPOFOL 10 MG/ML IV BOLUS
INTRAVENOUS | Status: AC
  Filled 2015-02-10: qty 20

## 2015-02-10 MED ORDER — ONDANSETRON HCL 4 MG/2ML IJ SOLN
4.0000 mg | Freq: Once | INTRAMUSCULAR | Status: AC | PRN
  Administered 2015-02-10: 4 mg via INTRAVENOUS
  Filled 2015-02-10: qty 2

## 2015-02-10 MED ORDER — DEXAMETHASONE SODIUM PHOSPHATE 4 MG/ML IJ SOLN
INTRAMUSCULAR | Status: AC
  Filled 2015-02-10: qty 1

## 2015-02-10 MED ORDER — GLYCOPYRROLATE 0.2 MG/ML IJ SOLN
INTRAMUSCULAR | Status: DC | PRN
  Administered 2015-02-10: 0.4 mg via INTRAVENOUS

## 2015-02-10 MED ORDER — FENTANYL CITRATE (PF) 250 MCG/5ML IJ SOLN
INTRAMUSCULAR | Status: AC
  Filled 2015-02-10: qty 25

## 2015-02-10 MED ORDER — KETOROLAC TROMETHAMINE 30 MG/ML IJ SOLN
INTRAMUSCULAR | Status: AC
  Filled 2015-02-10: qty 1

## 2015-02-10 SURGICAL SUPPLY — 51 items
APPLIER CLIP LAPSCP 10X32 DD (CLIP) ×3 IMPLANT
BAG HAMPER (MISCELLANEOUS) ×3 IMPLANT
BAG SPEC RTRVL LRG 6X4 10 (ENDOMECHANICALS) ×1
CHLORAPREP W/TINT 26ML (MISCELLANEOUS) ×3 IMPLANT
CLOTH BEACON ORANGE TIMEOUT ST (SAFETY) ×3 IMPLANT
COVER LIGHT HANDLE STERIS (MISCELLANEOUS) ×6 IMPLANT
CUTTER FLEX LINEAR 45M (STAPLE) ×2 IMPLANT
DECANTER SPIKE VIAL GLASS SM (MISCELLANEOUS) ×3 IMPLANT
ELECT REM PT RETURN 9FT ADLT (ELECTROSURGICAL) ×3
ELECTRODE REM PT RTRN 9FT ADLT (ELECTROSURGICAL) ×1 IMPLANT
FILTER SMOKE EVAC LAPAROSHD (FILTER) ×3 IMPLANT
FORMALIN 10 PREFIL 120ML (MISCELLANEOUS) ×3 IMPLANT
GLOVE BIO SURGEON STRL SZ 6.5 (GLOVE) ×2 IMPLANT
GLOVE BIO SURGEONS STRL SZ 6.5 (GLOVE) ×2
GLOVE BIOGEL PI IND STRL 7.0 (GLOVE) IMPLANT
GLOVE BIOGEL PI IND STRL 7.5 (GLOVE) IMPLANT
GLOVE BIOGEL PI INDICATOR 7.0 (GLOVE) ×4
GLOVE BIOGEL PI INDICATOR 7.5 (GLOVE) ×2
GLOVE SURG SS PI 7.5 STRL IVOR (GLOVE) ×3 IMPLANT
GOWN STRL REUS W/ TWL XL LVL3 (GOWN DISPOSABLE) ×1 IMPLANT
GOWN STRL REUS W/TWL LRG LVL3 (GOWN DISPOSABLE) ×6 IMPLANT
GOWN STRL REUS W/TWL XL LVL3 (GOWN DISPOSABLE) ×3
HEMOSTAT SNOW SURGICEL 2X4 (HEMOSTASIS) ×7 IMPLANT
INST SET LAPROSCOPIC AP (KITS) ×3 IMPLANT
IV NS 1000ML (IV SOLUTION) ×6
IV NS 1000ML BAXH (IV SOLUTION) IMPLANT
IV NS IRRIG 3000ML ARTHROMATIC (IV SOLUTION) IMPLANT
KIT ROOM TURNOVER APOR (KITS) ×3 IMPLANT
MANIFOLD NEPTUNE II (INSTRUMENTS) ×3 IMPLANT
NDL INSUFFLATION 14GA 120MM (NEEDLE) ×1 IMPLANT
NEEDLE INSUFFLATION 14GA 120MM (NEEDLE) ×3 IMPLANT
NS IRRIG 1000ML POUR BTL (IV SOLUTION) ×3 IMPLANT
PACK LAP CHOLE LZT030E (CUSTOM PROCEDURE TRAY) ×3 IMPLANT
PAD ARMBOARD 7.5X6 YLW CONV (MISCELLANEOUS) ×3 IMPLANT
POUCH SPECIMEN RETRIEVAL 10MM (ENDOMECHANICALS) ×3 IMPLANT
RELOAD STAPLE 45 3.5 BLU ETS (ENDOMECHANICALS) IMPLANT
RELOAD STAPLE TA45 3.5 REG BLU (ENDOMECHANICALS) ×3 IMPLANT
SET BASIN LINEN APH (SET/KITS/TRAYS/PACK) ×3 IMPLANT
SET TUBE IRRIG SUCTION NO TIP (IRRIGATION / IRRIGATOR) IMPLANT
SLEEVE ENDOPATH XCEL 5M (ENDOMECHANICALS) ×3 IMPLANT
SPONGE GAUZE 2X2 8PLY STER LF (GAUZE/BANDAGES/DRESSINGS) ×4
SPONGE GAUZE 2X2 8PLY STRL LF (GAUZE/BANDAGES/DRESSINGS) ×8 IMPLANT
STAPLER VISISTAT (STAPLE) ×3 IMPLANT
SUT VICRYL 0 UR6 27IN ABS (SUTURE) ×5 IMPLANT
TAPE CLOTH SURG 4X10 WHT LF (GAUZE/BANDAGES/DRESSINGS) ×2 IMPLANT
TROCAR ENDO BLADELESS 11MM (ENDOMECHANICALS) ×3 IMPLANT
TROCAR XCEL NON-BLD 5MMX100MML (ENDOMECHANICALS) ×3 IMPLANT
TROCAR XCEL UNIV SLVE 11M 100M (ENDOMECHANICALS) ×3 IMPLANT
TUBING INSUFFLATION (TUBING) ×3 IMPLANT
WARMER LAPAROSCOPE (MISCELLANEOUS) ×3 IMPLANT
YANKAUER SUCT 12FT TUBE ARGYLE (SUCTIONS) ×3 IMPLANT

## 2015-02-10 NOTE — Transfer of Care (Signed)
Immediate Anesthesia Transfer of Care Note  Patient: Melissa Bullock  Procedure(s) Performed: Procedure(s): LAPAROSCOPIC CHOLECYSTECTOMY (N/A)  Patient Location: PACU  Anesthesia Type:General  Level of Consciousness: awake, alert , oriented and patient cooperative  Airway & Oxygen Therapy: Patient Spontanous Breathing and Patient connected to face mask oxygen  Post-op Assessment: Report given to RN, Post -op Vital signs reviewed and stable and Patient moving all extremities  Post vital signs: Reviewed and stable  Last Vitals:  Filed Vitals:   02/10/15 1135  BP: 120/58  Pulse:   Temp:   Resp: 14    Complications: No apparent anesthesia complications

## 2015-02-10 NOTE — Discharge Instructions (Signed)

## 2015-02-10 NOTE — Anesthesia Postprocedure Evaluation (Signed)
  Anesthesia Post-op Note  Patient: Melissa Bullock  Procedure(s) Performed: Procedure(s): LAPAROSCOPIC CHOLECYSTECTOMY (N/A)  Patient Location: PACU  Anesthesia Type:General  Level of Consciousness: awake, alert , oriented and patient cooperative  Airway and Oxygen Therapy: Patient Spontanous Breathing  Post-op Pain: 2 /10, mild  Post-op Assessment: Post-op Vital signs reviewed, Patient's Cardiovascular Status Stable, Respiratory Function Stable, Patent Airway, Pain level controlled and No headache              Post-op Vital Signs: Reviewed and stable  Last Vitals:  Filed Vitals:   02/10/15 1315  BP: 143/65  Pulse: 73  Temp: 36.8 C  Resp: 13    Complications: No apparent anesthesia complications

## 2015-02-10 NOTE — Interval H&P Note (Signed)
History and Physical Interval Note:  02/10/2015 11:03 AM  Melissa Bullock  has presented today for surgery, with the diagnosis of cholelithiasis  The various methods of treatment have been discussed with the patient and family. After consideration of risks, benefits and other options for treatment, the patient has consented to  Procedure(s): LAPAROSCOPIC CHOLECYSTECTOMY (N/A) as a surgical intervention .  The patient's history has been reviewed, patient examined, no change in status, stable for surgery.  I have reviewed the patient's chart and labs.  Questions were answered to the patient's satisfaction.     Franky Macho A

## 2015-02-10 NOTE — Anesthesia Procedure Notes (Signed)
Procedure Name: Intubation Date/Time: 02/10/2015 11:50 AM Performed by: Despina Hidden Pre-anesthesia Checklist: Emergency Drugs available, Patient identified, Suction available and Patient being monitored Patient Re-evaluated:Patient Re-evaluated prior to inductionOxygen Delivery Method: Circle system utilized Preoxygenation: Pre-oxygenation with 100% oxygen Intubation Type: IV induction, Rapid sequence and Cricoid Pressure applied Ventilation: Mask ventilation without difficulty Laryngoscope Size: Mac and 3 Grade View: Grade I Tube type: Oral Number of attempts: 1 Airway Equipment and Method: Stylet Placement Confirmation: ETT inserted through vocal cords under direct vision,  positive ETCO2 and breath sounds checked- equal and bilateral Secured at: 22 cm Tube secured with: Tape Dental Injury: Teeth and Oropharynx as per pre-operative assessment

## 2015-02-10 NOTE — Op Note (Signed)
Patient:  ELLICE BOULTINGHOUSE  DOB:  02-02-1946  MRN:  [id number]   Preop Diagnosis:  Cholecystitis, cholelithiasis  Postop Diagnosis:  Same  Procedure:  Laparoscopic cholecystectomy  Surgeon:  Franky Macho, M.D.  Anes:  Gen. endotracheal  Indications:  Patient is a 69 year old white female who presents with cholecystitis secondary to cholelithiasis. She is already undergone an ERCP due to choledocholithiasis. The risks and benefits of the procedure including bleeding, infection, hepatobiliary injury, and the possibility of an open procedure were fully explained to the patient, who gave informed consent.  Procedure note:  The patient was placed the supine position. After induction of general endotracheal anesthesia, the abdomen was prepped and draped using the usual sterile technique with DuraPrep. Surgical site confirmation was performed.  A supraumbilical incision was made down to the fascia. A Veress needle was introduced into the abdominal cavity and confirmation of placement was done using the saline drop test. The abdomen was then insufflated to 16 mmHg pressure. 11 mm trocar was introduced into the abdominal cavity under direct visualization without difficulty. Patient was placed in reverse Trendelenburg position and additional 11 mm trocar was placed the epigastric region and 5 mm trochars were placed the right upper quadrant and right flank regions. Liver was inspected and noted within normal limits. The gallbladder was noted to be distended with a thickened gallbladder wall. It was decompressed at its fundus. Hydrops and some purulent fluid was noted. This was evacuated without difficulty. The gallbladder was retracted in a dynamic fashion in order to exposed the triangle of Calot. The cystic duct was first identified. Its junction to the infundibulum was fully identified. Endoclips were placed proximally and distally on the cystic duct, and the cystic duct was divided. This was likewise  done to the cystic artery. The gallbladder was freed away from the gallbladder fossa using Bovie electrocautery. The gallbladder was delivered through the epigastric trocar site using an Endo Catch bag. The gallbladder fossa was inspected and no abnormal bleeding or bile leakage was noted. Surgicel is placed the gallbladder fossa. All fluid and air were then evacuated from the abdominal cavity prior to removal of the trochars.  All wounds were irrigated normal saline. All wounds were injected with 0.5% Sensorcaine. The supraumbilical fascia as well as epigastric fascia were reapproximated using 0 Vicryl interrupted sutures. All skin incisions were closed using staples. Betadine ointment and dry sterile dressings were applied.  All tape and needle counts were correct at the end of the procedure. The patient was extubated in the operating room and transferred to PACU in stable condition.  Complications:  None  EBL:  Less than 100 mL  Specimen:  Gallbladder

## 2015-02-10 NOTE — Anesthesia Preprocedure Evaluation (Addendum)
Anesthesia Evaluation  Patient identified by MRN, date of birth, ID band Patient awake    Reviewed: Allergy & Precautions, NPO status , Patient's Chart, lab work & pertinent test results  Airway Mallampati: II  TM Distance: >3 FB     Dental  (+) Teeth Intact, Dental Advisory Given   Pulmonary neg pulmonary ROS,  breath sounds clear to auscultation        Cardiovascular negative cardio ROS  Rhythm:Regular Rate:Normal     Neuro/Psych    GI/Hepatic hiatal hernia, GERD-  Controlled and Medicated,  Endo/Other    Renal/GU      Musculoskeletal   Abdominal   Peds  Hematology   Anesthesia Other Findings   Reproductive/Obstetrics                             Anesthesia Physical Anesthesia Plan  ASA: II  Anesthesia Plan: General   Post-op Pain Management:    Induction: Intravenous, Rapid sequence and Cricoid pressure planned  Airway Management Planned: Oral ETT  Additional Equipment:   Intra-op Plan:   Post-operative Plan: Extubation in OR  Informed Consent: I have reviewed the patients History and Physical, chart, labs and discussed the procedure including the risks, benefits and alternatives for the proposed anesthesia with the patient or authorized representative who has indicated his/her understanding and acceptance.     Plan Discussed with:   Anesthesia Plan Comments:         Anesthesia Quick Evaluation

## 2015-02-13 ENCOUNTER — Encounter (HOSPITAL_COMMUNITY): Payer: Self-pay | Admitting: General Surgery

## 2015-07-01 ENCOUNTER — Emergency Department (HOSPITAL_COMMUNITY)
Admission: EM | Admit: 2015-07-01 | Discharge: 2015-07-01 | Disposition: A | Discharge status: Home / Self Care | Payer: Medicare HMO | Attending: Emergency Medicine | Admitting: Emergency Medicine

## 2015-07-01 ENCOUNTER — Emergency Department (HOSPITAL_COMMUNITY): Payer: Medicare HMO

## 2015-07-01 ENCOUNTER — Encounter (HOSPITAL_COMMUNITY): Payer: Self-pay | Admitting: Emergency Medicine

## 2015-07-01 DIAGNOSIS — Z8619 Personal history of other infectious and parasitic diseases: Secondary | ICD-10-CM | POA: Insufficient documentation

## 2015-07-01 DIAGNOSIS — Z8719 Personal history of other diseases of the digestive system: Secondary | ICD-10-CM | POA: Diagnosis not present

## 2015-07-01 DIAGNOSIS — Z7982 Long term (current) use of aspirin: Secondary | ICD-10-CM | POA: Insufficient documentation

## 2015-07-01 DIAGNOSIS — E7800 Pure hypercholesterolemia, unspecified: Secondary | ICD-10-CM | POA: Diagnosis not present

## 2015-07-01 DIAGNOSIS — Z79899 Other long term (current) drug therapy: Secondary | ICD-10-CM | POA: Insufficient documentation

## 2015-07-01 DIAGNOSIS — R05 Cough: Secondary | ICD-10-CM | POA: Diagnosis present

## 2015-07-01 DIAGNOSIS — J219 Acute bronchiolitis, unspecified: Secondary | ICD-10-CM | POA: Insufficient documentation

## 2015-07-01 MED ORDER — AZITHROMYCIN 250 MG PO TABS: 250.0000 mg | ORAL_TABLET | Freq: Every day | ORAL | Status: DC

## 2015-07-01 MED ORDER — BENZONATATE 100 MG PO CAPS: 100.0000 mg | ORAL_CAPSULE | Freq: Three times a day (TID) | ORAL | Status: DC

## 2015-07-01 NOTE — ED Notes (Signed)
Pt states that she has a lot of chest and nasal congestion for the past few days.

## 2015-07-01 NOTE — Discharge Instructions (Signed)

## 2015-07-01 NOTE — ED Provider Notes (Signed)
CSN: [id number]     Arrival date & time 07/01/15  1035 History   First MD Initiated Contact with Patient 07/01/15 1056     Chief Complaint  Patient presents with  . Cough     (Consider location/radiation/quality/duration/timing/severity/associated sxs/prior Treatment) Patient is a 69 y.o. female presenting with cough. The history is provided by the patient. No language interpreter was used.  Cough Cough characteristics:  Productive Sputum characteristics:  Nondescript Severity:  Moderate Onset quality:  Gradual Timing:  Constant Progression:  Worsening Chronicity:  New Smoker: no   Context: upper respiratory infection   Relieved by:  Nothing Worsened by:  Nothing tried Associated symptoms: fever, shortness of breath and sinus congestion   Associated symptoms: no rhinorrhea   Pt reports she has had a cough for a week.  Pt reports she had sinus congestion but this am she felt like infection had moved to her chest.  Pt has had bronchitis in the past and this feels the same. Pt concerned about pneumonia.  Pt reports she is past due for pneumonia and flu shot.  Past Medical History  Diagnosis Date  . Hypercholesteremia   . E coli enteritis   . Hiatal hernia    Past Surgical History  Procedure Laterality Date  . Ercp N/A 01/27/2015    Procedure: ENDOSCOPIC RETROGRADE CHOLANGIOPANCREATOGRAPHY (ERCP);  Surgeon: West Bali, MD;  Location: [facility];  Service: Endoscopy;  Laterality: N/A;  . Sphincterotomy N/A 01/27/2015    Procedure: SPHINCTEROTOMY;  Surgeon: West Bali, MD;  Location: [facility];  Service: Endoscopy;  Laterality: N/A;  . Removal of stones N/A 01/27/2015    Procedure: REMOVAL OF STONES;  Surgeon: West Bali, MD;  Location: [facility];  Service: Endoscopy;  Laterality: N/A;  . Cholecystectomy N/A 02/10/2015    Procedure: LAPAROSCOPIC CHOLECYSTECTOMY;  Surgeon: Franky Macho, MD;  Location: [facility];  Service: General;  Laterality: N/A;   Family History  Problem Relation  Age of Onset  . Heart disease Mother    Social History  Substance Use Topics  . Smoking status: Never Smoker   . Smokeless tobacco: None  . Alcohol Use: No   OB History    No data available     Review of Systems  Constitutional: Positive for fever.  HENT: Negative for rhinorrhea.   Respiratory: Positive for cough and shortness of breath.   All other systems reviewed and are negative.     Allergies  Sulfonamide derivatives  Home Medications   Prior to Admission medications   Medication Sig Start Date End Date Taking? Authorizing Provider  alum & mag hydroxide-simeth (MAALOX PLUS) 400-400-40 MG/5ML suspension Take 15 mLs by mouth every 6 (six) hours as needed for indigestion.    Historical Provider, MD  aspirin 81 MG tablet Take 81 mg by mouth daily.    Historical Provider, MD  co-enzyme Q-10 30 MG capsule Take 30 mg by mouth 3 (three) times daily.    Historical Provider, MD  dicyclomine (BENTYL) 10 MG capsule Take 10 mg by mouth 4 (four) times daily -  before meals and at bedtime.    Historical Provider, MD  Flaxseed, Linseed, (FLAX SEED OIL) 1000 MG CAPS Take 1 capsule by mouth daily.    Historical Provider, MD  HYDROcodone-acetaminophen (NORCO/VICODIN) 5-325 MG per tablet 1 OR 2 EVERY 6 H PRN FOR PAIN. DO NO TAKE WITH TYLENOL. 02/10/15   Franky Macho, MD  Multiple Vitamin (MULTIVITAMIN WITH MINERALS) TABS tablet Take 1 tablet by mouth  daily.    Historical Provider, MD  multivitamin-lutein [facility](OCUVITE-LUTEIN) CAPS capsule Take 1 capsule by mouth daily.    Historical Provider, MD  pantoprazole (PROTONIX) 40 MG tablet Take 40 mg by mouth daily.    Historical Provider, MD  promethazine (PHENERGAN) 12.5 MG tablet 1-2 po q6 h prn nausea or vomiting 01/30/15   West BaliSandi L Fields, MD  simvastatin (ZOCOR) 20 MG tablet Take 20 mg by mouth daily.    Historical Provider, MD   BP 145/69 mmHg  Pulse 89  Temp(Src) 98.2 F (36.8 C) (Oral)  Resp 16  Ht 5\' 4"  (1.626 m)  Wt 81.647 kg  BMI 30.88  kg/m2  SpO2 99% Physical Exam  Constitutional: She is oriented to person, place, and time. She appears well-developed and well-nourished.  HENT:  Head: Normocephalic.  Right Ear: External ear normal.  Left Ear: External ear normal.  Nose: Nose normal.  Mouth/Throat: Oropharynx is clear and moist.  Eyes: Conjunctivae are normal. Pupils are equal, round, and reactive to light.  Cardiovascular: Normal rate.   Pulmonary/Chest: Effort normal. She exhibits no tenderness.  rhonchi  Abdominal: Soft.  Musculoskeletal: Normal range of motion.  Neurological: She is alert and oriented to person, place, and time. She has normal reflexes.  Skin: Skin is warm.  Psychiatric: She has a normal mood and affect.  Nursing note and vitals reviewed.   ED Course  Procedures (including critical care time) Labs Review Labs Reviewed - No data to display  Imaging Review Dg Chest 2 View  07/01/2015  CLINICAL DATA:  COUGH, CHEST CONGESTION, PATIENT STATES " SHE HAS BEEN SICK SINCE Tuesday, CONGESTION STARTED IN SINUSES AND HAS MOVED TO HER CHEST, COUGH IS NON PRODUCTIVE" PATIENT STATES SHE HAS A HISTORY OF BRONCHITIS. EXAM: CHEST  2 VIEW COMPARISON:  None FINDINGS: Heart size is normal. There is perihilar peribronchial thickening. Atelectasis is identified in the right middle lobe and lingula. No focal consolidations or pleural effusions. No pulmonary edema. Surgical clips are noted in the upper abdomen. IMPRESSION: 1. Bronchitic changes. 2. Bilateral atelectasis. Electronically Signed   By: Norva PavlovElizabeth  Brown M.D.   On: 07/01/2015 11:34   I have personally reviewed and evaluated these images and lab results as part of my medical decision-making.   EKG Interpretation None      MDM   Final diagnoses:  Acute bronchiolitis due to unspecified organism    zithromax Tessalon perles     Melissa AreasLeslie K Sofia, PA-C 07/01/15 1234  Benjiman CoreNathan Pickering, MD 07/01/15 [phone]

## 2016-12-28 DIAGNOSIS — E782 Mixed hyperlipidemia: Secondary | ICD-10-CM | POA: Diagnosis not present

## 2016-12-28 DIAGNOSIS — E785 Hyperlipidemia, unspecified: Secondary | ICD-10-CM | POA: Diagnosis not present

## 2016-12-28 DIAGNOSIS — M79674 Pain in right toe(s): Secondary | ICD-10-CM | POA: Diagnosis not present

## 2016-12-28 DIAGNOSIS — N6459 Other signs and symptoms in breast: Secondary | ICD-10-CM | POA: Diagnosis not present

## 2016-12-30 ENCOUNTER — Other Ambulatory Visit (HOSPITAL_COMMUNITY): Payer: Self-pay | Admitting: Internal Medicine

## 2016-12-30 DIAGNOSIS — Z1231 Encounter for screening mammogram for malignant neoplasm of breast: Secondary | ICD-10-CM

## 2017-01-02 ENCOUNTER — Ambulatory Visit (HOSPITAL_COMMUNITY)
Admission: RE | Admit: 2017-01-02 | Discharge: 2017-01-02 | Disposition: A | Discharge status: Home / Self Care | Payer: Medicare HMO | Source: Ambulatory Visit | Attending: Internal Medicine | Admitting: Internal Medicine

## 2017-01-02 ENCOUNTER — Other Ambulatory Visit (HOSPITAL_COMMUNITY): Payer: Self-pay | Admitting: Internal Medicine

## 2017-01-02 DIAGNOSIS — R928 Other abnormal and inconclusive findings on diagnostic imaging of breast: Secondary | ICD-10-CM | POA: Diagnosis not present

## 2017-01-02 DIAGNOSIS — Z1231 Encounter for screening mammogram for malignant neoplasm of breast: Secondary | ICD-10-CM

## 2017-01-02 DIAGNOSIS — E785 Hyperlipidemia, unspecified: Secondary | ICD-10-CM | POA: Diagnosis not present

## 2017-01-07 ENCOUNTER — Other Ambulatory Visit (HOSPITAL_COMMUNITY): Payer: Self-pay | Admitting: Internal Medicine

## 2017-01-07 DIAGNOSIS — R928 Other abnormal and inconclusive findings on diagnostic imaging of breast: Secondary | ICD-10-CM

## 2017-01-21 ENCOUNTER — Ambulatory Visit (HOSPITAL_COMMUNITY)
Admission: RE | Admit: 2017-01-21 | Discharge: 2017-01-21 | Disposition: A | Discharge status: Home / Self Care | Payer: Medicare HMO | Source: Ambulatory Visit | Attending: Internal Medicine | Admitting: Internal Medicine

## 2017-01-21 DIAGNOSIS — R928 Other abnormal and inconclusive findings on diagnostic imaging of breast: Secondary | ICD-10-CM

## 2017-01-31 DIAGNOSIS — E785 Hyperlipidemia, unspecified: Secondary | ICD-10-CM | POA: Diagnosis not present

## 2017-01-31 DIAGNOSIS — E782 Mixed hyperlipidemia: Secondary | ICD-10-CM | POA: Diagnosis not present

## 2017-01-31 DIAGNOSIS — K219 Gastro-esophageal reflux disease without esophagitis: Secondary | ICD-10-CM | POA: Diagnosis not present

## 2017-01-31 DIAGNOSIS — R8279 Other abnormal findings on microbiological examination of urine: Secondary | ICD-10-CM | POA: Diagnosis not present

## 2017-01-31 DIAGNOSIS — M545 Low back pain: Secondary | ICD-10-CM | POA: Diagnosis not present

## 2017-01-31 DIAGNOSIS — E875 Hyperkalemia: Secondary | ICD-10-CM | POA: Diagnosis not present

## 2017-01-31 DIAGNOSIS — R109 Unspecified abdominal pain: Secondary | ICD-10-CM | POA: Diagnosis not present

## 2017-02-13 DIAGNOSIS — H5200 Hypermetropia, unspecified eye: Secondary | ICD-10-CM | POA: Diagnosis not present

## 2017-10-14 DIAGNOSIS — E785 Hyperlipidemia, unspecified: Secondary | ICD-10-CM | POA: Diagnosis not present

## 2018-02-20 DIAGNOSIS — Z0100 Encounter for examination of eyes and vision without abnormal findings: Secondary | ICD-10-CM | POA: Diagnosis not present

## 2018-03-05 DIAGNOSIS — Z0000 Encounter for general adult medical examination without abnormal findings: Secondary | ICD-10-CM | POA: Diagnosis not present

## 2018-03-05 DIAGNOSIS — E785 Hyperlipidemia, unspecified: Secondary | ICD-10-CM | POA: Diagnosis not present

## 2018-03-11 DIAGNOSIS — F419 Anxiety disorder, unspecified: Secondary | ICD-10-CM | POA: Diagnosis not present

## 2018-03-11 DIAGNOSIS — Z6828 Body mass index (BMI) 28.0-28.9, adult: Secondary | ICD-10-CM | POA: Diagnosis not present

## 2018-03-11 DIAGNOSIS — E663 Overweight: Secondary | ICD-10-CM | POA: Diagnosis not present

## 2018-03-11 DIAGNOSIS — G4700 Insomnia, unspecified: Secondary | ICD-10-CM | POA: Diagnosis not present

## 2018-03-11 DIAGNOSIS — Z713 Dietary counseling and surveillance: Secondary | ICD-10-CM | POA: Diagnosis not present

## 2018-03-11 DIAGNOSIS — Z79899 Other long term (current) drug therapy: Secondary | ICD-10-CM | POA: Diagnosis not present

## 2018-03-11 DIAGNOSIS — Z0000 Encounter for general adult medical examination without abnormal findings: Secondary | ICD-10-CM | POA: Diagnosis not present

## 2018-03-11 DIAGNOSIS — E785 Hyperlipidemia, unspecified: Secondary | ICD-10-CM | POA: Diagnosis not present

## 2018-03-19 DIAGNOSIS — L089 Local infection of the skin and subcutaneous tissue, unspecified: Secondary | ICD-10-CM | POA: Diagnosis not present

## 2018-03-19 DIAGNOSIS — F419 Anxiety disorder, unspecified: Secondary | ICD-10-CM | POA: Diagnosis not present

## 2018-06-08 DIAGNOSIS — G4700 Insomnia, unspecified: Secondary | ICD-10-CM | POA: Diagnosis not present

## 2018-06-08 DIAGNOSIS — K219 Gastro-esophageal reflux disease without esophagitis: Secondary | ICD-10-CM | POA: Diagnosis not present

## 2018-06-08 DIAGNOSIS — J309 Allergic rhinitis, unspecified: Secondary | ICD-10-CM | POA: Diagnosis not present

## 2018-06-08 DIAGNOSIS — F419 Anxiety disorder, unspecified: Secondary | ICD-10-CM | POA: Diagnosis not present

## 2018-06-08 DIAGNOSIS — H6121 Impacted cerumen, right ear: Secondary | ICD-10-CM | POA: Diagnosis not present

## 2018-06-08 DIAGNOSIS — H6692 Otitis media, unspecified, left ear: Secondary | ICD-10-CM | POA: Diagnosis not present

## 2018-06-08 DIAGNOSIS — Z79899 Other long term (current) drug therapy: Secondary | ICD-10-CM | POA: Diagnosis not present

## 2018-09-28 ENCOUNTER — Encounter: Payer: Self-pay | Admitting: Internal Medicine

## 2018-11-18 ENCOUNTER — Encounter: Payer: Self-pay | Admitting: Gastroenterology

## 2018-12-02 ENCOUNTER — Ambulatory Visit: Payer: Medicare HMO

## 2019-02-03 ENCOUNTER — Ambulatory Visit: Payer: Medicare HMO

## 2019-02-09 ENCOUNTER — Ambulatory Visit (INDEPENDENT_AMBULATORY_CARE_PROVIDER_SITE_OTHER): Payer: Medicare HMO | Admitting: *Deleted

## 2019-02-09 ENCOUNTER — Other Ambulatory Visit: Payer: Self-pay

## 2019-02-09 DIAGNOSIS — Z1211 Encounter for screening for malignant neoplasm of colon: Secondary | ICD-10-CM

## 2019-02-09 NOTE — Progress Notes (Signed)
Gastroenterology Pre-Procedure Review  Request Date: 02/09/2019 Requesting Physician: Denyce Robert @ [facility], Last TCS 03/20/2009 done by Dr. Gala Romney, left sided diverticula, no polyps  PATIENT REVIEW QUESTIONS: The patient responded to the following health history questions as indicated:    1. Diabetes Melitis: No 2. Joint replacements in the past 12 months: No 3. Major health problems in the past 3 months: No 4. Has an artificial valve or MVP: No 5. Has a defibrillator: No 6. Has been advised in past to take antibiotics in advance of a procedure like teeth cleaning: No 7. Family history of colon cancer: Yes, dad age: late 74's 8. Alcohol Use: No 9. History of sleep apnea: No 10. History of coronary artery or other vascular stents placed within the last 12 months: No 11. History of any prior anesthesia complications: No    MEDICATIONS & ALLERGIES:    Patient reports the following regarding taking any blood thinners:   Plavix? No Aspirin? Yes Coumadin? No Brilinta? No Xarelto? No Eliquis? No Pradaxa? No Savaysa? No Effient? No  Patient confirms/reports the following medications:  Current Outpatient Medications  Medication Sig Dispense Refill  . Ascorbic Acid (VITAMIN C) 1000 MG tablet Take 1,000 mg by mouth daily.    Marland Kitchen aspirin 81 MG tablet Take 81 mg by mouth daily.    . calcium carbonate (OSCAL) 1500 (600 Ca) MG TABS tablet Take 600 mg by mouth daily.    . Calcium Polycarbophil (FIBER-CAPS PO) Take by mouth 3 (three) times daily.    . ciclopirox (PENLAC) 8 % solution Apply topically as needed. Apply over nail and surrounding skin. Apply daily over previous coat. After seven (7) days, may remove with alcohol and continue cycle.    Marland Kitchen co-enzyme Q-10 30 MG capsule Take 200 mg by mouth daily.     Marland Kitchen Cod Liver Oil CAPS Take by mouth daily.    . diazepam (VALIUM) 5 MG tablet Take 5 mg by mouth as needed for anxiety.    Marland Kitchen ibuprofen (ADVIL) 200 MG tablet Take 200 mg by mouth as  needed.    . loratadine (CLARITIN) 10 MG tablet Take 10 mg by mouth daily.    . Multiple Vitamin (MULTIVITAMIN WITH MINERALS) TABS tablet Take 1 tablet by mouth daily.    . multivitamin-lutein (OCUVITE-LUTEIN) CAPS capsule Take 1 capsule by mouth daily.    . Omega-3 Fatty Acids (FISH OIL OMEGA-3) 1000 MG CAPS Take by mouth.    Marland Kitchen omeprazole (PRILOSEC) 20 MG capsule Take 20 mg by mouth as needed.    . Red Yeast Rice Extract 600 MG TABS Take by mouth. Takes 1200 mg daily    . simvastatin (ZOCOR) 20 MG tablet Take 10 mg by mouth every other day.     . TURMERIC PO Take by mouth daily.     No current facility-administered medications for this visit.     Patient confirms/reports the following allergies:  Allergies  Allergen Reactions  . Sulfonamide Derivatives Nausea And Vomiting    No orders of the defined types were placed in this encounter.   AUTHORIZATION INFORMATION Primary Insurance: [employer],  ID #:[id number],  Group #: [id number] Pre-Cert / Josem Kaufmann required: Not required  SCHEDULE INFORMATION: Procedure has been scheduled as follows:  Date: , Time:  Location: [facility] with Dr. Gala Romney  This Gastroenterology Pre-Precedure Review Form is being routed to the following provider(s): Walden Field, NP

## 2019-02-09 NOTE — Progress Notes (Signed)
Pt decided to put off scheduling colonoscopy.  She said that she was unaware that a Covid screening was required. She said she really didn't want to have a Covid screening.  She said that she would like to wait until sometime next year and call us back.

## 2019-02-11 NOTE — Progress Notes (Signed)
Noted. She can call when she's ready to schedule.

## 2019-07-28 ENCOUNTER — Telehealth: Payer: Self-pay | Admitting: *Deleted

## 2019-07-28 NOTE — Telephone Encounter (Signed)
Called pt and answered all questions.  She is going to see her PCP today.  She will get them to send another referral if she decides to pursue a colonoscopy.

## 2019-07-28 NOTE — Telephone Encounter (Signed)
Pt had a triage back in August and has multiple questions about procedure and preps. Please call her at [phone]

## 2020-01-30 LAB — COLOGUARD: COLOGUARD: NEGATIVE

## 2021-03-26 NOTE — Progress Notes (Deleted)
Referring Provider:*** Primary Care Physician:  Pearson Grippe, MD Primary Gastroenterologist:  Dr. Jena Gauss  No chief complaint on file.   HPI:   Melissa Bullock is a 75 y.o. female presenting today due to dyspepsia, constipation, diarrhea.  History of choledocholithiasis in 2016 s/p ERCP with stone removal and sphincterotomy, followed by cholecystectomy. Patient was triaged for a colonoscopy in 2020, but decided to hold off on scheduling due to COVID test requirement. Last colonoscopy 03/20/2009, left sided diverticula, no polyps.     Past Medical History:  Diagnosis Date   E coli enteritis    Hiatal hernia    Hypercholesteremia     Past Surgical History:  Procedure Laterality Date   CHOLECYSTECTOMY N/A 02/10/2015   Procedure: LAPAROSCOPIC CHOLECYSTECTOMY;  Surgeon: Franky Macho, MD;  Location: [facility];  Service: General;  Laterality: N/A;   ERCP N/A 01/27/2015   Procedure: ENDOSCOPIC RETROGRADE CHOLANGIOPANCREATOGRAPHY (ERCP);  Surgeon: West Bali, MD;  Location: [facility];  Service: Endoscopy;  Laterality: N/A;   REMOVAL OF STONES N/A 01/27/2015   Procedure: REMOVAL OF STONES;  Surgeon: West Bali, MD;  Location: [facility];  Service: Endoscopy;  Laterality: N/A;   SPHINCTEROTOMY N/A 01/27/2015   Procedure: SPHINCTEROTOMY;  Surgeon: West Bali, MD;  Location: [facility];  Service: Endoscopy;  Laterality: N/A;    Current Outpatient Medications  Medication Sig Dispense Refill   Ascorbic Acid (VITAMIN C) 1000 MG tablet Take 1,000 mg by mouth daily.     aspirin 81 MG tablet Take 81 mg by mouth daily.     calcium carbonate (OSCAL) 1500 (600 Ca) MG TABS tablet Take 600 mg by mouth daily.     Calcium Polycarbophil (FIBER-CAPS PO) Take by mouth 3 (three) times daily.     ciclopirox (PENLAC) 8 % solution Apply topically as needed. Apply over nail and surrounding skin. Apply daily over previous coat. After seven (7) days, may remove with alcohol and continue cycle.     co-enzyme Q-10 30  MG capsule Take 200 mg by mouth daily.      Cod Liver Oil CAPS Take by mouth daily.     diazepam (VALIUM) 5 MG tablet Take 5 mg by mouth as needed for anxiety.     ibuprofen (ADVIL) 200 MG tablet Take 200 mg by mouth as needed.     loratadine (CLARITIN) 10 MG tablet Take 10 mg by mouth daily.     Multiple Vitamin (MULTIVITAMIN WITH MINERALS) TABS tablet Take 1 tablet by mouth daily.     multivitamin-lutein (OCUVITE-LUTEIN) CAPS capsule Take 1 capsule by mouth daily.     Omega-3 Fatty Acids (FISH OIL OMEGA-3) 1000 MG CAPS Take by mouth.     omeprazole (PRILOSEC) 20 MG capsule Take 20 mg by mouth as needed.     Red Yeast Rice Extract 600 MG TABS Take by mouth. Takes 1200 mg daily     simvastatin (ZOCOR) 20 MG tablet Take 10 mg by mouth every other day.      TURMERIC PO Take by mouth daily.     No current facility-administered medications for this visit.    Allergies as of 03/28/2021 - Review Complete 02/09/2019  Allergen Reaction Noted   Sulfonamide derivatives Nausea And Vomiting     Family History  Problem Relation Age of Onset   Heart disease Mother     Social History   Socioeconomic History   Marital status: Divorced    Spouse name: Not on file   Number of  children: Not on file   Years of education: Not on file   Highest education level: Not on file  Occupational History   Not on file  Tobacco Use   Smoking status: Never   Smokeless tobacco: Not on file  Substance and Sexual Activity   Alcohol use: No   Drug use: Not on file   Sexual activity: Not on file  Other Topics Concern   Not on file  Social History Narrative   Not on file   Social Determinants of Health   Financial Resource Strain: Not on file  Food Insecurity: Not on file  Transportation Needs: Not on file  Physical Activity: Not on file  Stress: Not on file  Social Connections: Not on file  Intimate Partner Violence: Not on file    Review of Systems: Gen: Denies any fever, chills, fatigue,  weight loss, lack of appetite.  CV: Denies chest pain, heart palpitations, peripheral edema, syncope.  Resp: Denies shortness of breath at rest or with exertion. Denies wheezing or cough.  GI: Denies dysphagia or odynophagia. Denies jaundice, hematemesis, fecal incontinence. GU : Denies urinary burning, urinary frequency, urinary hesitancy MS: Denies joint pain, muscle weakness, cramps, or limitation of movement.  Derm: Denies rash, itching, dry skin Psych: Denies depression, anxiety, memory loss, and confusion Heme: Denies bruising, bleeding, and enlarged lymph nodes.  Physical Exam: There were no vitals taken for this visit. General:   Alert and oriented. Pleasant and cooperative. Well-nourished and well-developed.  Head:  Normocephalic and atraumatic. Eyes:  Without icterus, sclera clear and conjunctiva pink.  Ears:  Normal auditory acuity. Nose:  No deformity, discharge,  or lesions. Mouth:  No deformity or lesions, oral mucosa pink.  Neck:  Supple, without mass or thyromegaly. Lungs:  Clear to auscultation bilaterally. No wheezes, rales, or rhonchi. No distress.  Heart:  S1, S2 present without murmurs appreciated.  Abdomen:  +BS, soft, non-tender and non-distended. No HSM noted. No guarding or rebound. No masses appreciated.  Rectal:  Deferred  Msk:  Symmetrical without gross deformities. Normal posture. Pulses:  Normal pulses noted. Extremities:  Without clubbing or edema. Neurologic:  Alert and  oriented x4;  grossly normal neurologically. Skin:  Intact without significant lesions or rashes. Cervical Nodes:  No significant cervical adenopathy. Psych:  Alert and cooperative. Normal mood and affect.

## 2021-03-28 ENCOUNTER — Ambulatory Visit: Payer: Medicare HMO | Admitting: Gastroenterology

## 2021-07-04 ENCOUNTER — Telehealth: Payer: Self-pay

## 2021-07-04 NOTE — Telephone Encounter (Signed)
Pt is needing to be put on the wait list for an appt with Dr. Jena Gauss for GERD, gas and pelvic prolapse.

## 2021-07-11 ENCOUNTER — Encounter: Payer: Self-pay | Admitting: Internal Medicine

## 2021-08-15 ENCOUNTER — Encounter: Payer: Self-pay | Admitting: Internal Medicine

## 2021-08-15 ENCOUNTER — Ambulatory Visit: Payer: Medicare HMO | Admitting: Internal Medicine

## 2021-08-15 ENCOUNTER — Other Ambulatory Visit: Payer: Self-pay

## 2021-08-15 VITALS — BP 137/75 | HR 71 | Temp 97.5°F | Ht 63.0 in | Wt 166.8 lb

## 2021-08-15 DIAGNOSIS — R140 Abdominal distension (gaseous): Secondary | ICD-10-CM | POA: Diagnosis not present

## 2021-08-15 DIAGNOSIS — K219 Gastro-esophageal reflux disease without esophagitis: Secondary | ICD-10-CM

## 2021-08-15 NOTE — Progress Notes (Signed)
Primary Care Physician:  Joya Salm Primary Gastroenterologist:  Dr. Marletta Lor  Chief Complaint  Patient presents with   Gastroesophageal Reflux    C/o reflux takes omep but doesn't help   Bloated    And gas   pelvic prolapse    Surgery in April in [place]    HPI:   Melissa Bullock is a 76 y.o. female who presents to the clinic today by referral from her PCP Dr. Clarise Cruz for evaluation.  She has multiple GI complaints for me today.  She states she has had issues with reflux for many years.  Previously on pantoprazole.  Now on omeprazole 20 mg daily.  Notes breakthrough heartburn which she describes as "burning in my chest."  Mild to moderate in severity.  Intermittent in nature.  No dysphagia odynophagia.  Also notes abdominal bloating.  States she had a stomach infection years ago but unsure which type.  Unsure if this was H. pylori or not.  She does have E. coli enteritis listed in her past medical history.  No melena hematochezia.  States her last colonoscopy was around 15 years ago when a bad experience due to post procedure bloating and pain.  States she had a Cologuard test done last year which was normal.  Past Medical History:  Diagnosis Date   E coli enteritis    Hiatal hernia    Hypercholesteremia     Past Surgical History:  Procedure Laterality Date   CHOLECYSTECTOMY N/A 02/10/2015   Procedure: LAPAROSCOPIC CHOLECYSTECTOMY;  Surgeon: Franky Macho, MD;  Location: [facility];  Service: General;  Laterality: N/A;   ERCP N/A 01/27/2015   Procedure: ENDOSCOPIC RETROGRADE CHOLANGIOPANCREATOGRAPHY (ERCP);  Surgeon: West Bali, MD;  Location: [facility];  Service: Endoscopy;  Laterality: N/A;   REMOVAL OF STONES N/A 01/27/2015   Procedure: REMOVAL OF STONES;  Surgeon: West Bali, MD;  Location: [facility];  Service: Endoscopy;  Laterality: N/A;   SPHINCTEROTOMY N/A 01/27/2015   Procedure: SPHINCTEROTOMY;  Surgeon: West Bali, MD;  Location: [facility];  Service: Endoscopy;   Laterality: N/A;    Current Outpatient Medications  Medication Sig Dispense Refill   acetaminophen (TYLENOL) 500 MG tablet Take 500 mg by mouth every 6 (six) hours as needed.     co-enzyme Q-10 30 MG capsule Take by mouth daily.     diazepam (VALIUM) 5 MG tablet Take 5 mg by mouth as needed for anxiety.     ibuprofen (ADVIL) 200 MG tablet Take 200 mg by mouth as needed.     Multiple Vitamin (MULTIVITAMIN WITH MINERALS) TABS tablet Take 1 tablet by mouth daily.     multivitamin-lutein (OCUVITE-LUTEIN) CAPS capsule Take 1 capsule by mouth daily.     omeprazole (PRILOSEC) 20 MG capsule Take 20 mg by mouth daily.     Red Yeast Rice Extract 600 MG TABS Take by mouth. Takes 1200 mg days she doesn't take simvistatin     simvastatin (ZOCOR) 20 MG tablet Take 20 mg by mouth every other day.     No current facility-administered medications for this visit.    Allergies as of 08/15/2021 - Review Complete 08/15/2021  Allergen Reaction Noted   Sulfonamide derivatives Nausea And Vomiting     Family History  Problem Relation Age of Onset   Heart disease Mother     Social History   Socioeconomic History   Marital status: Divorced    Spouse name: Not on file   Number of children: Not on  file   Years of education: Not on file   Highest education level: Not on file  Occupational History   Not on file  Tobacco Use   Smoking status: Never   Smokeless tobacco: Not on file  Substance and Sexual Activity   Alcohol use: No   Drug use: Not on file   Sexual activity: Not on file  Other Topics Concern   Not on file  Social History Narrative   Not on file   Social Determinants of Health   Financial Resource Strain: Not on file  Food Insecurity: Not on file  Transportation Needs: Not on file  Physical Activity: Not on file  Stress: Not on file  Social Connections: Not on file  Intimate Partner Violence: Not on file    Subjective: Review of Systems  Constitutional:  Negative for chills  and fever.  HENT:  Negative for congestion and hearing loss.   Eyes:  Negative for blurred vision and double vision.  Respiratory:  Negative for cough and shortness of breath.   Cardiovascular:  Negative for chest pain and palpitations.  Gastrointestinal:  Positive for heartburn. Negative for abdominal pain, blood in stool, constipation, diarrhea, melena and vomiting.       Bloating  Genitourinary:  Negative for dysuria and urgency.  Musculoskeletal:  Negative for joint pain and myalgias.  Skin:  Negative for itching and rash.  Neurological:  Negative for dizziness and headaches.  Psychiatric/Behavioral:  Negative for depression. The patient is not nervous/anxious.       Objective: BP 137/75    Pulse 71    Temp (!) 97.5 F (36.4 C)    Ht 5\' 3"  (1.6 m)    Wt 166 lb 12.8 oz (75.7 kg)    BMI 29.55 kg/m  Physical Exam Constitutional:      Appearance: Normal appearance.  HENT:     Head: Normocephalic and atraumatic.  Eyes:     Extraocular Movements: Extraocular movements intact.     Conjunctiva/sclera: Conjunctivae normal.  Cardiovascular:     Rate and Rhythm: Normal rate and regular rhythm.  Pulmonary:     Effort: Pulmonary effort is normal.     Breath sounds: Normal breath sounds.  Abdominal:     General: Bowel sounds are normal.     Palpations: Abdomen is soft.  Musculoskeletal:        General: No swelling. Normal range of motion.     Cervical back: Normal range of motion and neck supple.  Skin:    General: Skin is warm and dry.     Coloration: Skin is not jaundiced.  Neurological:     General: No focal deficit present.     Mental Status: She is alert and oriented to person, place, and time.  Psychiatric:        Mood and Affect: Mood normal.        Behavior: Behavior normal.     Assessment: *Abdominal bloating *Heartburn-not well controlled on omeprazole  Plan: Will schedule for EGD to evaluate for peptic ulcer disease, esophagitis, gastritis, H. Pylori,  duodenitis, or other. Will also evaluate for esophageal stricture, Schatzki's ring, esophageal web or other.   The risks including infection, bleed, or perforation as well as benefits, limitations, alternatives and imponderables have been reviewed with the patient. Potential for esophageal dilation, biopsy, etc. have also been reviewed.  Questions have been answered. All parties agreeable.  Continue on omeprazole 20 mg daily.  May need to make adjustments pending endoscopic evaluation.  Colon  cancer screening due in 2 years.  Reports negative Cologuard 2022.  Thank you Dr. Clarise Cruz for the kind referral.   08/15/2021 5:18 PM   Disclaimer: This note was dictated with voice recognition software. Similar sounding words can inadvertently be transcribed and may not be corrected upon review.

## 2021-08-15 NOTE — Patient Instructions (Signed)
We will schedule you for upper endoscopy to further evaluate your bloating and heartburn.  I will test you for bacterial infection with H. pylori as well at that time.  Continue on omeprazole for now.  We may make adjustments pending endoscopic evaluation.  It was very nice meeting you today.  Dr. Abbey Bullock  At [facility] we value your feedback. You may receive a survey about your visit today. Please share your experience as we strive to create trusting relationships with our patients to provide genuine, compassionate, quality care.  We appreciate your understanding and patience as we review any laboratory studies, imaging, and other diagnostic tests that are ordered as we care for you. Our office policy is 5 business days for review of these results, and any emergent or urgent results are addressed in a timely manner for your best interest. If you do not hear from our office in 1 week, please contact us.   We also encourage the use of MyChart, which contains your medical information for your review as well. If you are not enrolled in this feature, an access code is on this after visit summary for your convenience. Thank you for allowing [place] to be involved in your care.  It was great to see you today!  I hope you have a great rest of your Winter!    Melissa Bullock, D.O. Gastroenterology and Hepatology [facility]

## 2021-08-15 NOTE — H&P (View-Only) (Signed)
Primary Care Physician:  Joya Salm Primary Gastroenterologist:  Dr. Marletta Lor  Chief Complaint  Patient presents with   Gastroesophageal Reflux    C/o reflux takes omep but doesn't help   Bloated    And gas   pelvic prolapse    Surgery in April in [place]    HPI:   Melissa Bullock is a 76 y.o. female who presents to the clinic today by referral from her PCP Dr. Clarise Cruz for evaluation.  She has multiple GI complaints for me today.  She states she has had issues with reflux for many years.  Previously on pantoprazole.  Now on omeprazole 20 mg daily.  Notes breakthrough heartburn which she describes as "burning in my chest."  Mild to moderate in severity.  Intermittent in nature.  No dysphagia odynophagia.  Also notes abdominal bloating.  States she had a stomach infection years ago but unsure which type.  Unsure if this was H. pylori or not.  She does have E. coli enteritis listed in her past medical history.  No melena hematochezia.  States her last colonoscopy was around 15 years ago when a bad experience due to post procedure bloating and pain.  States she had a Cologuard test done last year which was normal.  Past Medical History:  Diagnosis Date   E coli enteritis    Hiatal hernia    Hypercholesteremia     Past Surgical History:  Procedure Laterality Date   CHOLECYSTECTOMY N/A 02/10/2015   Procedure: LAPAROSCOPIC CHOLECYSTECTOMY;  Surgeon: Franky Macho, MD;  Location: [facility];  Service: General;  Laterality: N/A;   ERCP N/A 01/27/2015   Procedure: ENDOSCOPIC RETROGRADE CHOLANGIOPANCREATOGRAPHY (ERCP);  Surgeon: West Bali, MD;  Location: [facility];  Service: Endoscopy;  Laterality: N/A;   REMOVAL OF STONES N/A 01/27/2015   Procedure: REMOVAL OF STONES;  Surgeon: West Bali, MD;  Location: [facility];  Service: Endoscopy;  Laterality: N/A;   SPHINCTEROTOMY N/A 01/27/2015   Procedure: SPHINCTEROTOMY;  Surgeon: West Bali, MD;  Location: [facility];  Service: Endoscopy;   Laterality: N/A;    Current Outpatient Medications  Medication Sig Dispense Refill   acetaminophen (TYLENOL) 500 MG tablet Take 500 mg by mouth every 6 (six) hours as needed.     co-enzyme Q-10 30 MG capsule Take by mouth daily.     diazepam (VALIUM) 5 MG tablet Take 5 mg by mouth as needed for anxiety.     ibuprofen (ADVIL) 200 MG tablet Take 200 mg by mouth as needed.     Multiple Vitamin (MULTIVITAMIN WITH MINERALS) TABS tablet Take 1 tablet by mouth daily.     multivitamin-lutein (OCUVITE-LUTEIN) CAPS capsule Take 1 capsule by mouth daily.     omeprazole (PRILOSEC) 20 MG capsule Take 20 mg by mouth daily.     Red Yeast Rice Extract 600 MG TABS Take by mouth. Takes 1200 mg days she doesn't take simvistatin     simvastatin (ZOCOR) 20 MG tablet Take 20 mg by mouth every other day.     No current facility-administered medications for this visit.    Allergies as of 08/15/2021 - Review Complete 08/15/2021  Allergen Reaction Noted   Sulfonamide derivatives Nausea And Vomiting     Family History  Problem Relation Age of Onset   Heart disease Mother     Social History   Socioeconomic History   Marital status: Divorced    Spouse name: Not on file   Number of children: Not on  file   Years of education: Not on file   Highest education level: Not on file  Occupational History   Not on file  Tobacco Use   Smoking status: Never   Smokeless tobacco: Not on file  Substance and Sexual Activity   Alcohol use: No   Drug use: Not on file   Sexual activity: Not on file  Other Topics Concern   Not on file  Social History Narrative   Not on file   Social Determinants of Health   Financial Resource Strain: Not on file  Food Insecurity: Not on file  Transportation Needs: Not on file  Physical Activity: Not on file  Stress: Not on file  Social Connections: Not on file  Intimate Partner Violence: Not on file    Subjective: Review of Systems  Constitutional:  Negative for chills  and fever.  HENT:  Negative for congestion and hearing loss.   Eyes:  Negative for blurred vision and double vision.  Respiratory:  Negative for cough and shortness of breath.   Cardiovascular:  Negative for chest pain and palpitations.  Gastrointestinal:  Positive for heartburn. Negative for abdominal pain, blood in stool, constipation, diarrhea, melena and vomiting.       Bloating  Genitourinary:  Negative for dysuria and urgency.  Musculoskeletal:  Negative for joint pain and myalgias.  Skin:  Negative for itching and rash.  Neurological:  Negative for dizziness and headaches.  Psychiatric/Behavioral:  Negative for depression. The patient is not nervous/anxious.       Objective: BP 137/75    Pulse 71    Temp (!) 97.5 F (36.4 C)    Ht 5\' 3"  (1.6 m)    Wt 166 lb 12.8 oz (75.7 kg)    BMI 29.55 kg/m  Physical Exam Constitutional:      Appearance: Normal appearance.  HENT:     Head: Normocephalic and atraumatic.  Eyes:     Extraocular Movements: Extraocular movements intact.     Conjunctiva/sclera: Conjunctivae normal.  Cardiovascular:     Rate and Rhythm: Normal rate and regular rhythm.  Pulmonary:     Effort: Pulmonary effort is normal.     Breath sounds: Normal breath sounds.  Abdominal:     General: Bowel sounds are normal.     Palpations: Abdomen is soft.  Musculoskeletal:        General: No swelling. Normal range of motion.     Cervical back: Normal range of motion and neck supple.  Skin:    General: Skin is warm and dry.     Coloration: Skin is not jaundiced.  Neurological:     General: No focal deficit present.     Mental Status: She is alert and oriented to person, place, and time.  Psychiatric:        Mood and Affect: Mood normal.        Behavior: Behavior normal.     Assessment: *Abdominal bloating *Heartburn-not well controlled on omeprazole  Plan: Will schedule for EGD to evaluate for peptic ulcer disease, esophagitis, gastritis, H. Pylori,  duodenitis, or other. Will also evaluate for esophageal stricture, Schatzki's ring, esophageal web or other.   The risks including infection, bleed, or perforation as well as benefits, limitations, alternatives and imponderables have been reviewed with the patient. Potential for esophageal dilation, biopsy, etc. have also been reviewed.  Questions have been answered. All parties agreeable.  Continue on omeprazole 20 mg daily.  May need to make adjustments pending endoscopic evaluation.  Colon  cancer screening due in 2 years.  Reports negative Cologuard 2022.  Thank you Dr. Clarise Cruz for the kind referral.   08/15/2021 5:18 PM   Disclaimer: This note was dictated with voice recognition software. Similar sounding words can inadvertently be transcribed and may not be corrected upon review.

## 2021-08-21 ENCOUNTER — Telehealth: Payer: Self-pay

## 2021-08-21 NOTE — Telephone Encounter (Signed)
Returned the call from message left on my vm to the pt. LMOVM

## 2021-08-24 ENCOUNTER — Telehealth: Payer: Self-pay

## 2021-08-24 ENCOUNTER — Telehealth: Payer: Self-pay | Admitting: Internal Medicine

## 2021-08-24 NOTE — Telephone Encounter (Signed)
error 

## 2021-08-24 NOTE — Telephone Encounter (Signed)
[phone]  please call patient

## 2021-08-24 NOTE — Telephone Encounter (Signed)
Dr. Marletta Lor This pt has phoned again, did you ever speak to another Dr regarding this pt? I'm trying to locate the first note I sent you but i'm also discharging pt's today. Please advise

## 2021-08-29 NOTE — Telephone Encounter (Signed)
Lmom for pt to return my call.  

## 2021-08-31 ENCOUNTER — Ambulatory Visit (HOSPITAL_COMMUNITY): Payer: Medicare HMO | Admitting: Anesthesiology

## 2021-08-31 ENCOUNTER — Ambulatory Visit (HOSPITAL_COMMUNITY)
Admission: RE | Admit: 2021-08-31 | Discharge: 2021-08-31 | Disposition: A | Discharge status: Home / Self Care | Payer: Medicare HMO | Attending: Internal Medicine | Admitting: Internal Medicine

## 2021-08-31 ENCOUNTER — Other Ambulatory Visit: Payer: Self-pay

## 2021-08-31 ENCOUNTER — Encounter (HOSPITAL_COMMUNITY): Payer: Self-pay

## 2021-08-31 ENCOUNTER — Ambulatory Visit (HOSPITAL_BASED_OUTPATIENT_CLINIC_OR_DEPARTMENT_OTHER): Payer: Medicare HMO | Admitting: Anesthesiology

## 2021-08-31 ENCOUNTER — Encounter (HOSPITAL_COMMUNITY)
Admission: RE | Disposition: A | Discharge status: Home / Self Care | Payer: Self-pay | Source: Home / Self Care | Attending: Internal Medicine

## 2021-08-31 DIAGNOSIS — K2970 Gastritis, unspecified, without bleeding: Secondary | ICD-10-CM

## 2021-08-31 DIAGNOSIS — K219 Gastro-esophageal reflux disease without esophagitis: Secondary | ICD-10-CM | POA: Insufficient documentation

## 2021-08-31 DIAGNOSIS — R1013 Epigastric pain: Secondary | ICD-10-CM | POA: Insufficient documentation

## 2021-08-31 DIAGNOSIS — R140 Abdominal distension (gaseous): Secondary | ICD-10-CM | POA: Insufficient documentation

## 2021-08-31 HISTORY — PX: ESOPHAGOGASTRODUODENOSCOPY (EGD) WITH PROPOFOL: SHX5813

## 2021-08-31 HISTORY — PX: BIOPSY: SHX5522

## 2021-08-31 SURGERY — ESOPHAGOGASTRODUODENOSCOPY (EGD) WITH PROPOFOL
Anesthesia: General

## 2021-08-31 MED ORDER — PANTOPRAZOLE SODIUM 40 MG PO TBEC: 40.0000 mg | DELAYED_RELEASE_TABLET | Freq: Every day | ORAL | 5 refills | Status: AC

## 2021-08-31 MED ORDER — LIDOCAINE HCL (CARDIAC) PF 100 MG/5ML IV SOSY
PREFILLED_SYRINGE | INTRAVENOUS | Status: DC | PRN
  Administered 2021-08-31: 50 mg via INTRAVENOUS

## 2021-08-31 MED ORDER — PROPOFOL 10 MG/ML IV BOLUS
INTRAVENOUS | Status: DC | PRN
  Administered 2021-08-31: 100 mg via INTRAVENOUS

## 2021-08-31 MED ORDER — LACTATED RINGERS IV SOLN: INTRAVENOUS | Status: DC

## 2021-08-31 NOTE — Op Note (Addendum)
[facility] ?Patient Name: Melissa Bullock ?Procedure Date: 08/31/2021 10:58 AM ?MRN: [id number] ?Date of Birth: 03/02/1946 ?Attending MD: Elon Alas. Abbey Chatters , DO ?CSN: [id number] ?Age: 76 ?Admit Type: Outpatient ?Procedure:                Upper GI endoscopy ?Indications:              Epigastric abdominal pain, Heartburn ?Providers:                Elon Alas. Abbey Chatters, DO, [place]ville Page, [place]                          Risa Grill, Technician ?Referring MD:              ?Medicines:                See the Anesthesia note for documentation of the  ?                          administered medications ?Complications:            No immediate complications. ?Estimated Blood Loss:     Estimated blood loss was minimal. ?Procedure:                Pre-Anesthesia Assessment: ?                          - The anesthesia plan was to use monitored  ?                          anesthesia care (MAC). ?                          After obtaining informed consent, the endoscope was  ?                          passed under direct vision. Throughout the  ?                          procedure, the patient's blood pressure, pulse, and  ?                          oxygen saturations were monitored continuously. The  ?                          GIF-H190 ([id number]) scope was introduced through the  ?                          mouth, and advanced to the second part of duodenum.  ?                          The upper GI endoscopy was accomplished without  ?                          difficulty. The patient tolerated the procedure  ?                          well. ?Scope In:  11:45:02 AM ?Scope Out: 11:47:20 AM ?Total Procedure Duration: 0 hours 2 minutes 18 seconds  ?Findings: ?     The Z-line was regular and was found 38 cm from the incisors. ?     There is no endoscopic evidence of bleeding, areas of erosion,  ?     esophagitis, hiatal hernia, ulcerations or varices in the entire  ?     esophagus. ?     Localized mild inflammation characterized by  erythema was found in the  ?     gastric antrum. Biopsies were taken with a cold forceps for Helicobacter  ?     pylori testing. ?     The duodenal bulb, first portion of the duodenum and second portion of  ?     the duodenum were normal. ?Impression:               - Z-line regular, 38 cm from the incisors. ?                          - Gastritis. Biopsied. ?                          - Normal duodenal bulb, first portion of the  ?                          duodenum and second portion of the duodenum. ?Moderate Sedation: ?     Per Anesthesia Care ?Recommendation:           - Patient has a contact number available for  ?                          emergencies. The signs and symptoms of potential  ?                          delayed complications were discussed with the  ?                          patient. Return to normal activities tomorrow.  ?                          Written discharge instructions were provided to the  ?                          patient. ?                          - Resume previous diet. ?                          - Continue present medications. ?                          - Await pathology results. ?                          - Return to GI clinic in 4 months. ?                          -  Use Protonix (pantoprazole) 40 mg PO daily. ?Procedure Code(s):        --- Professional --- ?                          [phone], Esophagogastroduodenoscopy, flexible,  ?                          transoral; with biopsy, single or multiple ?Diagnosis Code(s):        --- Professional --- ?                          K29.70, Gastritis, unspecified, without bleeding ?                          R10.13, Epigastric pain ?                          R12, Heartburn ?CPT copyright 2019 [employer]. All rights reserved. ?The codes documented in this report are preliminary and upon coder review may  ?be revised to meet current compliance requirements. ?Elon Alas. Abbey Chatters, DO ?Elon Alas. [place], DO ?08/31/2021 11:53:10 AM ?This  report has been signed electronically. ?Number of Addenda: 0 ?

## 2021-08-31 NOTE — Anesthesia Preprocedure Evaluation (Addendum)
Anesthesia Evaluation  ?Patient identified by MRN, date of birth, ID band ?Patient awake ? ? ? ?Reviewed: ?Allergy & Precautions, NPO status , Patient's Chart, lab work & pertinent test results ? ?Airway ?Mallampati: II ? ?TM Distance: >3 FB ?Neck ROM: Full ? ? ? Dental ? ?(+) Dental Advisory Given ?  ?Pulmonary ?neg pulmonary ROS,  ?  ?Pulmonary exam normal ?breath sounds clear to auscultation ? ? ? ? ? ? Cardiovascular ?Exercise Tolerance: Good ?negative cardio ROS ?Normal cardiovascular exam ?Rhythm:Regular Rate:Normal ? ? ?  ?Neuro/Psych ?negative neurological ROS ? negative psych ROS  ? GI/Hepatic ?Neg liver ROS, hiatal hernia, GERD  Medicated and Controlled,  ?Endo/Other  ?negative endocrine ROS ? Renal/GU ?negative Renal ROS  ?negative genitourinary ?  ?Musculoskeletal ?negative musculoskeletal ROS ?(+)  ? Abdominal ?  ?Peds ?negative pediatric ROS ?(+)  Hematology ?negative hematology ROS ?(+)   ?Anesthesia Other Findings ? ? Reproductive/Obstetrics ?negative OB ROS ? ?  ? ? ? ? ? ? ? ? ? ? ? ? ? ?  ?  ? ? ? ? ? ? ? ?Anesthesia Physical ?Anesthesia Plan ? ?ASA: 2 ? ?Anesthesia Plan: General  ? ?Post-op Pain Management: Minimal or no pain anticipated  ? ?Induction: Intravenous ? ?PONV Risk Score and Plan: TIVA ? ?Airway Management Planned: Nasal Cannula and Natural Airway ? ?Additional Equipment:  ? ?Intra-op Plan:  ? ?Post-operative Plan:  ? ?Informed Consent: I have reviewed the patients History and Physical, chart, labs and discussed the procedure including the risks, benefits and alternatives for the proposed anesthesia with the patient or authorized representative who has indicated his/her understanding and acceptance.  ? ? ? ?Dental advisory given ? ?Plan Discussed with: CRNA and Surgeon ? ?Anesthesia Plan Comments:   ? ? ? ? ? ?Anesthesia Quick Evaluation ? ?

## 2021-08-31 NOTE — Telephone Encounter (Signed)
Pt is having a procedure today and will try her back next week( closing early today and I'm out Monday) ?

## 2021-08-31 NOTE — Anesthesia Postprocedure Evaluation (Signed)
Anesthesia Post Note ? ?Patient: Melissa Bullock ? ?Procedure(s) Performed: ESOPHAGOGASTRODUODENOSCOPY (EGD) WITH PROPOFOL ?BIOPSY ? ?Patient location during evaluation: Endoscopy ?Anesthesia Type: General ?Level of consciousness: awake and alert and oriented ?Pain management: pain level controlled ?Vital Signs Assessment: post-procedure vital signs reviewed and stable ?Respiratory status: spontaneous breathing, nonlabored ventilation and respiratory function stable ?Cardiovascular status: blood pressure returned to baseline and stable ?Postop Assessment: no apparent nausea or vomiting ?Anesthetic complications: no ? ? ?No notable events documented. ? ? ?Last Vitals:  ?Vitals:  ? 08/31/21 1152 08/31/21 1154  ?BP:  116/60  ?Pulse: 68 63  ?Resp: 17 17  ?Temp:    ?SpO2:  95%  ?  ?Last Pain:  ?Vitals:  ? 08/31/21 1152  ?TempSrc:   ?PainSc: 4   ? ? ?  ?  ?  ?  ?  ?  ? ?[name] C Irene Collings ? ? ? ? ?

## 2021-08-31 NOTE — Discharge Instructions (Addendum)
EGD ?Discharge instructions ?Please read the instructions outlined below and refer to this sheet in the next few weeks. These discharge instructions provide you with general information on caring for yourself after you leave the hospital. Your doctor may also give you specific instructions. While your treatment has been planned according to the most current medical practices available, unavoidable complications occasionally occur. If you have any problems or questions after discharge, please call your doctor. ?ACTIVITY ?You may resume your regular activity but move at a slower pace for the next 24 hours.  ?Take frequent rest periods for the next 24 hours.  ?Walking will help expel (get rid of) the air and reduce the bloated feeling in your abdomen.  ?No driving for 24 hours (because of the anesthesia (medicine) used during the test).  ?You may shower.  ?Do not sign any important legal documents or operate any machinery for 24 hours (because of the anesthesia used during the test).  ?NUTRITION ?Drink plenty of fluids.  ?You may resume your normal diet.  ?Begin with a light meal and progress to your normal diet.  ?Avoid alcoholic beverages for 24 hours or as instructed by your caregiver.  ?MEDICATIONS ?You may resume your normal medications unless your caregiver tells you otherwise.  ?WHAT YOU CAN EXPECT TODAY ?You may experience abdominal discomfort such as a feeling of fullness or ?gas? pains.  ?FOLLOW-UP ?Your doctor will discuss the results of your test with you.  ?SEEK IMMEDIATE MEDICAL ATTENTION IF ANY OF THE FOLLOWING OCCUR: ?Excessive nausea (feeling sick to your stomach) and/or vomiting.  ?Severe abdominal pain and distention (swelling).  ?Trouble swallowing.  ?Temperature over 101? F (37.8? C).  ?Rectal bleeding or vomiting of blood.  ? ?Your EGD revealed mild amount inflammation in your stomach.  I took biopsies of this to rule out infection with a bacteria called H. pylori.  Await pathology results, my  office will contact you. Im going to switch your omeprazole to pantoprazole. I want you to take 40 mg daily. Follow up with GI in 3-4 months.  ? ? ? ?I hope you have a great rest of your week! ? ?Hennie Duos. Marletta Lor, D.O. ?Gastroenterology and Hepatology ?[facility] ? ?

## 2021-08-31 NOTE — Interval H&P Note (Signed)
History and Physical Interval Note: ? ?08/31/2021 ?11:16 AM ? ?Melissa Bullock  has presented today for surgery, with the diagnosis of bloating, heartburn.  The various methods of treatment have been discussed with the patient and family. After consideration of risks, benefits and other options for treatment, the patient has consented to  Procedure(s) with comments: ?ESOPHAGOGASTRODUODENOSCOPY (EGD) WITH PROPOFOL (N/A) - 12:45pm as a surgical intervention.  The patient's history has been reviewed, patient examined, no change in status, stable for surgery.  I have reviewed the patient's chart and labs.  Questions were answered to the patient's satisfaction.   ? ? ?Eloise Harman ? ? ?

## 2021-08-31 NOTE — Transfer of Care (Signed)
Immediate Anesthesia Transfer of Care Note ? ?Patient: Melissa Bullock ? ?Procedure(s) Performed: ESOPHAGOGASTRODUODENOSCOPY (EGD) WITH PROPOFOL ?BIOPSY ? ?Patient Location: Short Stay ? ?Anesthesia Type:General ? ?Level of Consciousness: drowsy ? ?Airway & Oxygen Therapy: Patient Spontanous Breathing ? ?Post-op Assessment: Report given to RN and Post -op Vital signs reviewed and stable ? ?Post vital signs: Reviewed and stable ? ?Last Vitals:  ?Vitals Value Taken Time  ?BP    ?Temp    ?Pulse    ?Resp    ?SpO2    ? ? ?Last Pain:  ?Vitals:  ? 08/31/21 1141  ?TempSrc:   ?PainSc: 4   ?   ? ?Patients Stated Pain Goal: 3 (08/31/21 1127) ? ?Complications: No notable events documented. ?

## 2021-09-03 LAB — SURGICAL PATHOLOGY

## 2021-09-04 ENCOUNTER — Telehealth: Payer: Self-pay | Admitting: Internal Medicine

## 2021-09-04 NOTE — Telephone Encounter (Signed)
See lab result note.

## 2021-09-04 NOTE — Telephone Encounter (Signed)
Phoned and LMOVM for the pt to return call 

## 2021-09-04 NOTE — Telephone Encounter (Signed)
PATIENT RETURNED CALL, PLEASE CALL BACK  °

## 2021-09-04 NOTE — Telephone Encounter (Signed)
Pt was just wanting to get information on her procedure that she was having come up.  ?

## 2021-09-05 ENCOUNTER — Encounter (HOSPITAL_COMMUNITY): Payer: Self-pay | Admitting: Internal Medicine

## 2023-03-06 LAB — COLOGUARD: COLOGUARD: NEGATIVE
